# Patient Record
Sex: Female | Born: 1990 | Race: Black or African American | Hispanic: No | Marital: Single | Smoking: Former smoker
Health system: Southern US, Community
[De-identification: ages and names within clinical notes are randomized; demographics above are authoritative.]

## PROBLEM LIST (undated history)

## (undated) DIAGNOSIS — O1494 Unspecified pre-eclampsia, complicating childbirth: Secondary | ICD-10-CM

## (undated) DIAGNOSIS — Z98891 History of uterine scar from previous surgery: Secondary | ICD-10-CM

## (undated) HISTORY — DX: Unspecified pre-eclampsia, complicating childbirth: O14.94

## (undated) HISTORY — PX: TONSILLECTOMY AND ADENOIDECTOMY: SUR1326

---
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
## 2012-08-03 ENCOUNTER — Emergency Department: Payer: Self-pay | Admitting: Emergency Medicine

## 2012-08-03 LAB — URINALYSIS, COMPLETE
Bacteria: NONE SEEN
Glucose,UR: NEGATIVE mg/dL (ref 0–75)
Nitrite: NEGATIVE
Protein: NEGATIVE
Specific Gravity: 1.014 (ref 1.003–1.030)

## 2012-08-03 LAB — HCG, QUANTITATIVE, PREGNANCY: Beta Hcg, Quant.: 21498 m[IU]/mL — ABNORMAL HIGH

## 2012-08-03 LAB — CBC
HGB: 12.3 g/dL (ref 12.0–16.0)
MCHC: 34.2 g/dL (ref 32.0–36.0)
RBC: 4.05 10*6/uL (ref 3.80–5.20)
WBC: 5.4 10*3/uL (ref 3.6–11.0)

## 2012-09-21 ENCOUNTER — Emergency Department: Payer: Self-pay | Admitting: Internal Medicine

## 2012-09-21 LAB — URINALYSIS, COMPLETE
Bilirubin,UR: NEGATIVE
Blood: NEGATIVE
Glucose,UR: NEGATIVE mg/dL (ref 0–75)
Hyaline Cast: 8
Nitrite: NEGATIVE
Ph: 5 (ref 4.5–8.0)
Protein: 25
RBC,UR: 2 /HPF (ref 0–5)
Specific Gravity: 1.015 (ref 1.003–1.030)
Squamous Epithelial: 3
WBC UR: 2 /HPF (ref 0–5)

## 2012-09-21 LAB — HCG, QUANTITATIVE, PREGNANCY: Beta Hcg, Quant.: 27275 m[IU]/mL — ABNORMAL HIGH

## 2012-12-11 ENCOUNTER — Observation Stay: Payer: Self-pay

## 2012-12-11 LAB — URINALYSIS, COMPLETE
Bacteria: NONE SEEN
Glucose,UR: NEGATIVE mg/dL (ref 0–75)
Ketone: NEGATIVE
Protein: NEGATIVE
RBC,UR: 2 /HPF (ref 0–5)
Specific Gravity: 1.014 (ref 1.003–1.030)
WBC UR: 14 /HPF (ref 0–5)

## 2013-01-27 ENCOUNTER — Observation Stay: Payer: Self-pay

## 2013-01-27 LAB — PIH PROFILE
BUN: 5 mg/dL — ABNORMAL LOW (ref 7–18)
Co2: 25 mmol/L (ref 21–32)
EGFR (African American): >60
EGFR (Non-African Amer.): >60
HCT: 32.8 % — ABNORMAL LOW (ref 35.0–47.0)
HGB: 11.2 g/dL — ABNORMAL LOW (ref 12.0–16.0)
MCH: 30.4 pg (ref 26.0–34.0)
MCHC: 34.2 g/dL (ref 32.0–36.0)
Platelet: 345 10*3/uL (ref 150–440)
Potassium: 4 mmol/L (ref 3.5–5.1)
RBC: 3.7 10*6/uL — ABNORMAL LOW (ref 3.80–5.20)
RDW: 13.5 % (ref 11.5–14.5)
SGOT(AST): 10 U/L — ABNORMAL LOW (ref 15–37)
Sodium: 139 mmol/L (ref 136–145)
Uric Acid: 3.6 mg/dL (ref 2.6–6.0)
WBC: 9.1 10*3/uL (ref 3.6–11.0)

## 2013-01-27 LAB — PROTEIN / CREATININE RATIO, URINE: Protein/Creat. Ratio: 91 mg/gCREAT (ref 0–200)

## 2013-02-10 ENCOUNTER — Observation Stay: Payer: Self-pay

## 2013-02-10 LAB — PIH PROFILE
Anion Gap: 7 (ref 7–16)
BUN: 6 mg/dL — ABNORMAL LOW (ref 7–18)
Creatinine: 0.62 mg/dL (ref 0.60–1.30)
EGFR (African American): >60
EGFR (Non-African Amer.): >60
Glucose: 69 mg/dL (ref 65–99)
HCT: 34.2 % — ABNORMAL LOW (ref 35.0–47.0)
MCH: 30.5 pg (ref 26.0–34.0)
MCHC: 34.6 g/dL (ref 32.0–36.0)
MCV: 88 fL (ref 80–100)
Platelet: 324 10*3/uL (ref 150–440)
Potassium: 4 mmol/L (ref 3.5–5.1)
RBC: 3.89 10*6/uL (ref 3.80–5.20)
RDW: 13.2 % (ref 11.5–14.5)
SGOT(AST): 13 U/L — ABNORMAL LOW (ref 15–37)
Uric Acid: 4.1 mg/dL (ref 2.6–6.0)

## 2013-02-10 LAB — PROTEIN / CREATININE RATIO, URINE
Protein, Random Urine: 46 mg/dL — ABNORMAL HIGH (ref 0–12)
Protein/Creat. Ratio: 139 mg/gCREAT (ref 0–200)

## 2013-02-13 ENCOUNTER — Observation Stay: Payer: Self-pay | Admitting: Obstetrics and Gynecology

## 2013-02-17 ENCOUNTER — Encounter: Payer: Self-pay | Admitting: Maternal & Fetal Medicine

## 2013-02-17 ENCOUNTER — Inpatient Hospital Stay: Payer: Self-pay | Admitting: Obstetrics and Gynecology

## 2013-02-17 LAB — CBC WITH DIFFERENTIAL/PLATELET
Basophil #: 0 10*3/uL (ref 0.0–0.1)
Basophil %: 0.5 %
Eosinophil #: 0.1 10*3/uL (ref 0.0–0.7)
Eosinophil %: 1.5 %
HGB: 11.8 g/dL — ABNORMAL LOW (ref 12.0–16.0)
Lymphocyte #: 1.9 10*3/uL (ref 1.0–3.6)
Lymphocyte %: 19.5 %
MCHC: 34.8 g/dL (ref 32.0–36.0)
MCV: 89 fL (ref 80–100)
Monocyte #: 0.7 x10 3/mm (ref 0.2–0.9)
Monocyte %: 7.3 %
Neutrophil %: 71.2 %
Platelet: 326 10*3/uL (ref 150–440)
RBC: 3.83 10*6/uL (ref 3.80–5.20)
RDW: 13.3 % (ref 11.5–14.5)
WBC: 9.7 10*3/uL (ref 3.6–11.0)

## 2013-02-17 LAB — PROTEIN / CREATININE RATIO, URINE
Creatinine, Urine: 128.8 mg/dL — ABNORMAL HIGH (ref 30.0–125.0)
Protein/Creat. Ratio: 241 mg/gCREAT — ABNORMAL HIGH (ref 0–200)

## 2013-02-18 LAB — HEMATOCRIT: HCT: 33.5 % — ABNORMAL LOW (ref 35.0–47.0)

## 2013-07-04 ENCOUNTER — Emergency Department: Payer: Self-pay | Admitting: Emergency Medicine

## 2013-07-04 LAB — COMPREHENSIVE METABOLIC PANEL
Alkaline Phosphatase: 69 U/L
Anion Gap: 7 (ref 7–16)
BUN: 7 mg/dL (ref 7–18)
Calcium, Total: 9.1 mg/dL (ref 8.5–10.1)
Co2: 25 mmol/L (ref 21–32)
Creatinine: 0.7 mg/dL (ref 0.60–1.30)
EGFR (Non-African Amer.): >60
Glucose: 95 mg/dL (ref 65–99)
Osmolality: 275 (ref 275–301)
SGOT(AST): 9 U/L — ABNORMAL LOW (ref 15–37)
SGPT (ALT): 13 U/L (ref 12–78)
Sodium: 139 mmol/L (ref 136–145)
Total Protein: 7.7 g/dL (ref 6.4–8.2)

## 2013-07-04 LAB — URINALYSIS, COMPLETE
Glucose,UR: NEGATIVE mg/dL (ref 0–75)
Nitrite: NEGATIVE
Ph: 5 (ref 4.5–8.0)
RBC,UR: 8 /HPF (ref 0–5)
Specific Gravity: 1.013 (ref 1.003–1.030)
Squamous Epithelial: 2
WBC UR: 30 /HPF (ref 0–5)

## 2013-07-04 LAB — CBC
HGB: 12.2 g/dL (ref 12.0–16.0)
RDW: 13.3 % (ref 11.5–14.5)
WBC: 6.7 10*3/uL (ref 3.6–11.0)

## 2013-07-04 LAB — HCG, QUANTITATIVE, PREGNANCY: Beta Hcg, Quant.: 38690 m[IU]/mL — ABNORMAL HIGH

## 2013-07-04 LAB — LIPASE, BLOOD: Lipase: 90 U/L (ref 73–393)

## 2013-07-05 LAB — GC/CHLAMYDIA PROBE AMP

## 2013-07-15 ENCOUNTER — Emergency Department: Payer: Self-pay | Admitting: Emergency Medicine

## 2013-07-15 LAB — COMPREHENSIVE METABOLIC PANEL
Albumin: 3.4 g/dL (ref 3.4–5.0)
BUN: 6 mg/dL — ABNORMAL LOW (ref 7–18)
Bilirubin,Total: 0.6 mg/dL (ref 0.2–1.0)
Calcium, Total: 8.8 mg/dL (ref 8.5–10.1)
Co2: 24 mmol/L (ref 21–32)
Creatinine: 0.69 mg/dL (ref 0.60–1.30)
EGFR (African American): >60
Glucose: 87 mg/dL (ref 65–99)
Osmolality: 275 (ref 275–301)
Potassium: 3.3 mmol/L — ABNORMAL LOW (ref 3.5–5.1)
SGOT(AST): 16 U/L (ref 15–37)
SGPT (ALT): 12 U/L (ref 12–78)
Sodium: 139 mmol/L (ref 136–145)
Total Protein: 6.7 g/dL (ref 6.4–8.2)

## 2013-07-15 LAB — WET PREP, GENITAL

## 2013-07-15 LAB — URINALYSIS, COMPLETE
Bacteria: NONE SEEN
Bilirubin,UR: NEGATIVE
Blood: NEGATIVE
Ketone: NEGATIVE
Nitrite: NEGATIVE
Ph: 6 (ref 4.5–8.0)
Protein: NEGATIVE
Specific Gravity: 1.013 (ref 1.003–1.030)
Squamous Epithelial: 1
WBC UR: 12 /HPF (ref 0–5)

## 2013-07-15 LAB — CBC WITH DIFFERENTIAL/PLATELET
Basophil #: 0 10*3/uL (ref 0.0–0.1)
Basophil %: 0.4 %
Eosinophil #: 0.1 10*3/uL (ref 0.0–0.7)
HCT: 33.2 % — ABNORMAL LOW (ref 35.0–47.0)
HGB: 11.1 g/dL — ABNORMAL LOW (ref 12.0–16.0)
Lymphocyte #: 1.6 10*3/uL (ref 1.0–3.6)
MCH: 29.1 pg (ref 26.0–34.0)
MCHC: 33.5 g/dL (ref 32.0–36.0)
Platelet: 330 10*3/uL (ref 150–440)
RBC: 3.83 10*6/uL (ref 3.80–5.20)
WBC: 6.6 10*3/uL (ref 3.6–11.0)

## 2013-07-15 LAB — GC/CHLAMYDIA PROBE AMP

## 2013-07-24 ENCOUNTER — Encounter: Payer: Self-pay | Admitting: *Deleted

## 2013-07-24 ENCOUNTER — Ambulatory Visit (INDEPENDENT_AMBULATORY_CARE_PROVIDER_SITE_OTHER): Payer: Medicaid Other | Admitting: *Deleted

## 2013-07-24 VITALS — BP 115/87 | Ht 62.5 in | Wt 175.4 lb

## 2013-07-24 DIAGNOSIS — Z3481 Encounter for supervision of other normal pregnancy, first trimester: Secondary | ICD-10-CM

## 2013-07-24 DIAGNOSIS — Z3480 Encounter for supervision of other normal pregnancy, unspecified trimester: Secondary | ICD-10-CM

## 2013-07-24 MED ORDER — PROVIDA OB 20-20-1.25 MG PO CAPS: 1.0000 | ORAL_CAPSULE | Freq: Every day | ORAL | Status: DC

## 2013-07-24 MED ORDER — ONDANSETRON 8 MG PO TBDP: 8.0000 mg | ORAL_TABLET | Freq: Three times a day (TID) | ORAL | Status: DC | PRN

## 2013-07-24 NOTE — Progress Notes (Signed)
P-82  Patient is here today for her New OB intake visit.  Bedside ultrasound agrees with her LMP dating of 9 weeks and two days.  CRL measures 9wks 1 day today.  She does plan to breast feed.  She currently has a two year old daughter and a 63 months old son.  She wishes to have her tubes tied following this delivery.  She also wanted to let us know that she is high risk due to pre eclempsia that seems to set in around 27 weeks.  She was delivered early or went into labor early with each pregnancy.  She was seen for her previous pregnancy at [facility] and was not happy with her care so she is transferring her care to [place].  We will request these records and patient will follow up in two weeks with our office.  She will call sooner if she needs any thing from [place].  She has requested a Rx for zofran and prenatal vitamins and this has been called in for her.

## 2013-07-25 LAB — CULTURE, OB URINE
Colony Count: NO GROWTH
Organism ID, Bacteria: NO GROWTH

## 2013-07-25 LAB — OBSTETRIC PANEL
Antibody Screen: NEGATIVE
Basophils Absolute: 0 10*3/uL (ref 0.0–0.1)
Basophils Relative: 0 % (ref 0–1)
Eosinophils Absolute: 0.1 10*3/uL (ref 0.0–0.7)
HCT: 32.6 % — ABNORMAL LOW (ref 36.0–46.0)
Hemoglobin: 11.3 g/dL — ABNORMAL LOW (ref 12.0–15.0)
Hepatitis B Surface Ag: NEGATIVE
Lymphs Abs: 2 10*3/uL (ref 0.7–4.0)
MCHC: 34.7 g/dL (ref 30.0–36.0)
Monocytes Relative: 7 % (ref 3–12)
Neutro Abs: 2.4 10*3/uL (ref 1.7–7.7)
Neutrophils Relative %: 50 % (ref 43–77)
Platelets: 389 10*3/uL (ref 150–400)
Rh Type: POSITIVE

## 2013-08-11 ENCOUNTER — Ambulatory Visit (INDEPENDENT_AMBULATORY_CARE_PROVIDER_SITE_OTHER): Payer: Medicaid Other | Admitting: Obstetrics & Gynecology

## 2013-08-11 ENCOUNTER — Other Ambulatory Visit (HOSPITAL_COMMUNITY)
Admission: RE | Admit: 2013-08-11 | Discharge: 2013-08-11 | Disposition: A | Discharge status: Home / Self Care | Payer: Medicaid Other | Source: Ambulatory Visit | Attending: Obstetrics & Gynecology | Admitting: Obstetrics & Gynecology

## 2013-08-11 VITALS — BP 124/87 | Wt 170.0 lb

## 2013-08-11 DIAGNOSIS — Z01419 Encounter for gynecological examination (general) (routine) without abnormal findings: Secondary | ICD-10-CM | POA: Insufficient documentation

## 2013-08-11 DIAGNOSIS — Z113 Encounter for screening for infections with a predominantly sexual mode of transmission: Secondary | ICD-10-CM | POA: Insufficient documentation

## 2013-08-11 DIAGNOSIS — Z302 Encounter for sterilization: Secondary | ICD-10-CM

## 2013-08-11 DIAGNOSIS — O09291 Supervision of pregnancy with other poor reproductive or obstetric history, first trimester: Secondary | ICD-10-CM

## 2013-08-11 DIAGNOSIS — O09299 Supervision of pregnancy with other poor reproductive or obstetric history, unspecified trimester: Secondary | ICD-10-CM

## 2013-08-11 MED ORDER — ASPIRIN EC 81 MG PO TBEC: 81.0000 mg | DELAYED_RELEASE_TABLET | Freq: Every day | ORAL | Status: DC

## 2013-08-11 NOTE — Progress Notes (Signed)
P = 94 

## 2013-08-11 NOTE — Progress Notes (Signed)
Subjective:    Alicia Friedman is a [id number] at [redacted]w[redacted]d being seen today for her first obstetrical visit.  Her obstetrical history is significant for history of severe preeclampsia resulting in having early deliveries at 36 weeks and 34 weeks. Patient does intend to breast feed. Pregnancy history fully reviewed.  Patient reports no complaints.  Filed Vitals:   08/11/13 1502  BP: 124/87  Weight: 170 lb (77.111 kg)    HISTORY: OB History  Gravida Para Term Preterm AB SAB TAB Ectopic Multiple Living  6 3 0 3 2 2 0 0 0 2     # Outcome Date GA Lbr Len/2nd Weight Sex Delivery Anes PTL Lv  6 CUR           5 PRE 02/17/13 [redacted]w[redacted]d  3 lb 12 oz (1.701 kg) M SVD None  Y     Comments: BP 187/114 delivered due to pre eclempsia.  4 PRE 05/06/11 [redacted]w[redacted]d  4 lb 7 oz (2.013 kg) F SVD EPI  Y     Comments: delivered early due to pre eclempsia  3 PRE 05/15/10 [redacted]w[redacted]d  12.5 oz (0.355 kg) F SVD   SB  2 SAB 10/2009          1 SAB 07/2009             Past Medical History  Diagnosis Date  . Pre-eclampsia, delivered    Past Surgical History  Procedure Laterality Date  . 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 Tonsillectomy and adenoidectomy     Family History  Problem Relation Age of Onset  . Diabetes Mother   . Hypertension Mother   . Lupus Mother   . Thyroid disease Mother   . Cancer Maternal Grandmother     ovarian     Exam    Uterus:     Pelvic Exam:    Perineum: No Hemorrhoids, Normal Perineum   Vulva: normal   Vagina:  normal mucosa, normal discharge   Cervix: multiparous appearance and no bleeding following Pap but patient had yellow discharge and some tenderness during pap smear.   Adnexa: normal adnexa and no mass, fullness, tenderness   Bony Pelvis: gynecoid  System: Breast:  normal appearance, no masses or tenderness   Skin: normal coloration and turgor, no rashes   Neurologic: oriented, normal   Extremities: normal strength, tone, and muscle mass   HEENT PERRLA  and extra ocular movement intact   Mouth/Teeth mucous membranes moist, pharynx normal without lesions and dental hygiene poor   Neck supple and no masses   Cardiovascular: regular rate and rhythm   Respiratory:  appears well, vitals normal, no respiratory distress, acyanotic, normal RR, ear and throat exam is normal, neck free of mass or lymphadenopathy, chest clear, no wheezing, crepitations, rhonchi, normal symmetric air entry   Abdomen: soft, non-tender; bowel sounds normal; no masses,  no organomegaly   Urinary: urethral meatus normal    Assessment:    Pregnancy: [id number] Patient Active Problem List   Diagnosis Date Noted  . Desires Sterilization 08/11/2013  . Hx of severe preeclampsia in prior pregnancies, currently pregnant 08/11/2013     Plan:   Initial labs drawn reviewed and were normal. Continue prenatal vitamins. Recommended Aspirin 81 mg po daily given her history of severe preeclampsia, benefits/risks reviewed.  Patient agrees with this plan and this was prescribed for her.  Problem list reviewed and updated. Genetic Screening discussed First Screen: ordered.  MFM consult  also ordered given her high risk OB history, will follow up recommendations. Ultrasound discussed; fetal survey: to be ordered later. Desires permanent sterilization; papers will be signed around 28 weeks Declines flu vaccine today. Follow up in 4 weeks.   Jaynie Collins, MD, FACOG Attending Obstetrician & Gynecologist Faculty Practice, [facility]

## 2013-08-11 NOTE — Patient Instructions (Addendum)
Pregnancy - First Trimester During sexual intercourse, millions of sperm go into the vagina. Only 1 sperm will penetrate and fertilize the female egg while it is in the Fallopian tube. One week later, the fertilized egg implants into the wall of the uterus. An embryo begins to develop into a baby. At 6 to 8 weeks, the eyes and face are formed and the heartbeat can be seen on ultrasound. At the end of 12 weeks (first trimester), all the baby's organs are formed. Now that you are pregnant, you will want to do everything you can to have a healthy baby. Two of the most important things are to get good prenatal care and follow your caregiver's instructions. Prenatal care is all the medical care you receive before the baby's birth. It is given to prevent, find, and treat problems during the pregnancy and childbirth. PRENATAL EXAMS  During prenatal visits, your weight, blood pressure, and urine are checked. This is done to make sure you are healthy and progressing normally during the pregnancy.  A pregnant woman should gain 25 to 35 pounds during the pregnancy. However, if you are overweight or underweight, your caregiver will advise you regarding your weight.  Your caregiver will ask and answer questions for you.  Blood work, cervical cultures, other necessary tests, and a Pap test are done during your prenatal exams. These tests are done to check on your health and the probable health of your baby. Tests are strongly recommended and done for HIV with your permission. This is the virus that causes AIDS. These tests are done because medicines can be given to help prevent your baby from being born with this infection should you have been infected without knowing it. Blood work is also used to find out your blood type, previous infections, and follow your blood levels (hemoglobin).  Low hemoglobin (anemia) is common during pregnancy. Iron and vitamins are given to help prevent this. Later in the pregnancy,  blood tests for diabetes will be done along with any other tests if any problems develop.  You may need other tests to make sure you and the baby are doing well. CHANGES DURING THE FIRST TRIMESTER  Your body goes through many changes during pregnancy. They vary from person to person. Talk to your caregiver about changes you notice and are concerned about. Changes can include:  Your menstrual period stops.  The egg and sperm carry the genes that determine what you look like. Genes from you and your partner are forming a baby. The female genes determine whether the baby is a boy or a girl.  Your body increases in girth and you may feel bloated.  Feeling sick to your stomach (nauseous) and throwing up (vomiting). If the vomiting is uncontrollable, call your caregiver.  Your breasts will begin to enlarge and become tender.  Your nipples may stick out more and become darker.  The need to urinate more. Painful urination may mean you have a bladder infection.  Tiring easily.  Loss of appetite.  Cravings for certain kinds of food.  At first, you may gain or lose a couple of pounds.  You may have changes in your emotions from day to day (excited to be pregnant or concerned something may go wrong with the pregnancy and baby).  You may have more vivid and strange dreams. HOME CARE INSTRUCTIONS   It is very important to avoid all smoking, alcohol and non-prescribed drugs during your pregnancy. These affect the formation and growth of the baby.   Avoid chemicals while pregnant to ensure the delivery of a healthy infant.  Start your prenatal visits by the 12th week of pregnancy. They are usually scheduled monthly at first, then more often in the last 2 months before delivery. Keep your caregiver's appointments. Follow your caregiver's instructions regarding medicine use, blood and lab tests, exercise, and diet.  During pregnancy, you are providing food for you and your baby. Eat regular,  well-balanced meals. Choose foods such as meat, fish, milk and other low fat dairy products, vegetables, fruits, and whole-grain breads and cereals. Your caregiver will tell you of the ideal weight gain.  You can help morning sickness by keeping soda crackers at the bedside. Eat a couple before arising in the morning. You may want to use the crackers without salt on them.  Eating 4 to 5 small meals rather than 3 large meals a day also may help the nausea and vomiting.  Drinking liquids between meals instead of during meals also seems to help nausea and vomiting.  A physical sexual relationship may be continued throughout pregnancy if there are no other problems. Problems may be early (premature) leaking of amniotic fluid from the membranes, vaginal bleeding, or belly (abdominal) pain.  Exercise regularly if there are no restrictions. Check with your caregiver or physical therapist if you are unsure of the safety of some of your exercises. Greater weight gain will occur in the last 2 trimesters of pregnancy. Exercising will help:  Control your weight.  Keep you in shape.  Prepare you for labor and delivery.  Help you lose your pregnancy weight after you deliver your baby.  Wear a good support or jogging bra for breast tenderness during pregnancy. This may help if worn during sleep too.  Ask when prenatal classes are available. Begin classes when they are offered.  Do not use hot tubs, steam rooms, or saunas.  Wear your seat belt when driving. This protects you and your baby if you are in an accident.  Avoid raw meat, uncooked cheese, cat litter boxes, and soil used by cats throughout the pregnancy. These carry germs that can cause birth defects in the baby.  The first trimester is a good time to visit your dentist for your dental health. Getting your teeth cleaned is okay. Use a softer toothbrush and brush gently during pregnancy.  Ask for help if you have financial, counseling, or  nutritional needs during pregnancy. Your caregiver will be able to offer counseling for these needs as well as refer you for other special needs.  Do not take any medicines or herbs unless told by your caregiver.  Inform your caregiver if there is any mental or physical domestic violence.  Make a list of emergency phone numbers of family, friends, hospital, and police and fire departments.  Write down your questions. Take them to your prenatal visit.  Do not douche.  Do not cross your legs.  If you have to stand for long periods of time, rotate you feet or take small steps in a circle.  You may have more vaginal secretions that may require a sanitary pad. Do not use tampons or scented sanitary pads. MEDICINES AND DRUG USE IN PREGNANCY  Take prenatal vitamins as directed. The vitamin should contain 1 milligram of folic acid. Keep all vitamins out of reach of children. Only a couple vitamins or tablets containing iron may be fatal to a baby or young child when ingested.  Avoid use of all medicines, including herbs, over-the-counter medicines, not   prescribed or suggested by your caregiver. Only take over-the-counter or prescription medicines for pain, discomfort, or fever as directed by your caregiver. Do not use aspirin, ibuprofen, or naproxen unless directed by your caregiver.  Let your caregiver also know about herbs you may be using.  Alcohol is related to a number of birth defects. This includes fetal alcohol syndrome. All alcohol, in any form, should be avoided completely. Smoking will cause low birth rate and premature babies.  Street or illegal drugs are very harmful to the baby. They are absolutely forbidden. A baby born to an addicted mother will be addicted at birth. The baby will go through the same withdrawal an adult does.  Let your caregiver know about any medicines that you have to take and for what reason you take them. SEEK MEDICAL CARE IF:  You have any concerns or  worries during your pregnancy. It is better to call with your questions if you feel they cannot wait, rather than worry about them. SEEK IMMEDIATE MEDICAL CARE IF:   An unexplained oral temperature above 102 F (38.9 C) develops, or as your caregiver suggests.  You have leaking of fluid from the vagina (birth canal). If leaking membranes are suspected, take your temperature and inform your caregiver of this when you call.  There is vaginal spotting or bleeding. Notify your caregiver of the amount and how many pads are used.  You develop a bad smelling vaginal discharge with a change in the color.  You continue to feel sick to your stomach (nauseated) and have no relief from remedies suggested. You vomit blood or coffee ground-like materials.  You lose more than 2 pounds of weight in 1 week.  You gain more than 2 pounds of weight in 1 week and you notice swelling of your face, hands, feet, or legs.  You gain 5 pounds or more in 1 week (even if you do not have swelling of your hands, face, legs, or feet).  You get exposed to [place] measles and have never had them.  You are exposed to fifth disease or chickenpox.  You develop belly (abdominal) pain. Round ligament discomfort is a common non-cancerous (benign) cause of abdominal pain in pregnancy. Your caregiver still must evaluate this.  You develop headache, fever, diarrhea, pain with urination, or shortness of breath.  You fall or are in a car accident or have any kind of trauma.  There is mental or physical violence in your home. Document Released: 07/25/2001 Document Revised: 04/24/2012 Document Reviewed: 01/26/2009 [facility] Patient Information 2014 [place], [place].  Second Trimester of Pregnancy The second trimester is from week 13 through week 28, months 4 through 6. The second trimester is often a time when you feel your best. Your body has also adjusted to being pregnant, and you begin to feel better physically. Usually,  morning sickness has lessened or quit completely, you may have more energy, and you may have an increase in appetite. The second trimester is also a time when the fetus is growing rapidly. At the end of the sixth month, the fetus is about 9 inches long and weighs about 1 pounds. You will likely begin to feel the baby move (quickening) between 18 and 20 weeks of the pregnancy. BODY CHANGES Your body goes through many changes during pregnancy. The changes vary from woman to woman.   Your weight will continue to increase. You will notice your lower abdomen bulging out.  You may begin to get stretch marks on your hips, abdomen,  and breasts.  You may develop headaches that can be relieved by medicines approved by your caregiver.  You may urinate more often because the fetus is pressing on your bladder.  You may develop or continue to have heartburn as a result of your pregnancy.  You may develop constipation because certain hormones are causing the muscles that push waste through your intestines to slow down.  You may develop hemorrhoids or swollen, bulging veins (varicose veins).  You may have back pain because of the weight gain and pregnancy hormones relaxing your joints between the bones in your pelvis and as a result of a shift in weight and the muscles that support your balance.  Your breasts will continue to grow and be tender.  Your gums may bleed and may be sensitive to brushing and flossing.  Dark spots or blotches (chloasma, mask of pregnancy) may develop on your face. This will likely fade after the baby is born.  A dark line from your belly button to the pubic area (linea nigra) may appear. This will likely fade after the baby is born. WHAT TO EXPECT AT YOUR PRENATAL VISITS During a routine prenatal visit:  You will be weighed to make sure you and the fetus are growing normally.  Your blood pressure will be taken.  Your abdomen will be measured to track your baby's  growth.  The fetal heartbeat will be listened to.  Any test results from the previous visit will be discussed. Your caregiver may ask you:  How you are feeling.  If you are feeling the baby move.  If you have had any abnormal symptoms, such as leaking fluid, bleeding, severe headaches, or abdominal cramping.  If you have any questions. Other tests that may be performed during your second trimester include:  Blood tests that check for:  Low iron levels (anemia).  Gestational diabetes (between 24 and 28 weeks).  Rh antibodies.  Urine tests to check for infections, diabetes, or protein in the urine.  An ultrasound to confirm the proper growth and development of the baby.  An amniocentesis to check for possible genetic problems.  Fetal screens for spina bifida and Down syndrome. HOME CARE INSTRUCTIONS   Avoid all smoking, herbs, alcohol, and unprescribed drugs. These chemicals affect the formation and growth of the baby.  Follow your caregiver's instructions regarding medicine use. There are medicines that are either safe or unsafe to take during pregnancy.  Exercise only as directed by your caregiver. Experiencing uterine cramps is a good sign to stop exercising.  Continue to eat regular, healthy meals.  Wear a good support bra for breast tenderness.  Do not use hot tubs, steam rooms, or saunas.  Wear your seat belt at all times when driving.  Avoid raw meat, uncooked cheese, cat litter boxes, and soil used by cats. These carry germs that can cause birth defects in the baby.  Take your prenatal vitamins.  Try taking a stool softener (if your caregiver approves) if you develop constipation. Eat more high-fiber foods, such as fresh vegetables or fruit and whole grains. Drink plenty of fluids to keep your urine clear or pale yellow.  Take warm sitz baths to soothe any pain or discomfort caused by hemorrhoids. Use hemorrhoid cream if your caregiver approves.  If you  develop varicose veins, wear support hose. Elevate your feet for 15 minutes, 3 4 times a day. Limit salt in your diet.  Avoid heavy lifting, wear low heel shoes, and practice good posture.  Rest  with your legs elevated if you have leg cramps or low back pain.  Visit your dentist if you have not gone yet during your pregnancy. Use a soft toothbrush to brush your teeth and be gentle when you floss.  A sexual relationship may be continued unless your caregiver directs you otherwise.  Continue to go to all your prenatal visits as directed by your caregiver. SEEK MEDICAL CARE IF:   You have dizziness.  You have mild pelvic cramps, pelvic pressure, or nagging pain in the abdominal area.  You have persistent nausea, vomiting, or diarrhea.  You have a bad smelling vaginal discharge.  You have pain with urination. SEEK IMMEDIATE MEDICAL CARE IF:   You have a fever.  You are leaking fluid from your vagina.  You have spotting or bleeding from your vagina.  You have severe abdominal cramping or pain.  You have rapid weight gain or loss.  You have shortness of breath with chest pain.  You notice sudden or extreme swelling of your face, hands, ankles, feet, or legs.  You have not felt your baby move in over an hour.  You have severe headaches that do not go away with medicine.  You have vision changes. Document Released: 07/25/2001 Document Revised: 04/02/2013 Document Reviewed: 10/01/2012 [facility] Patient Information 2014 [place], [place].  Breastfeeding Deciding to breastfeed is one of the best choices you can make for you and your baby. A change in hormones during pregnancy causes your breast tissue to grow and increases the number and size of your milk ducts. These hormones also allow proteins, sugars, and fats from your blood supply to make breast milk in your milk-producing glands. Hormones prevent breast milk from being released before your baby is born as well as prompt milk  flow after birth. Once breastfeeding has begun, thoughts of your baby, as well as his or her sucking or crying, can stimulate the release of milk from your milk-producing glands.  BENEFITS OF BREASTFEEDING For Your Baby  Your first milk (colostrum) helps your baby's digestive system function better.   There are antibodies in your milk that help your baby fight off infections.   Your baby has a lower incidence of asthma, allergies, and sudden infant death syndrome.   The nutrients in breast milk are better for your baby than infant formulas and are designed uniquely for your baby's needs.   Breast milk improves your baby's brain development.   Your baby is less likely to develop other conditions, such as childhood obesity, asthma, or type 2 diabetes mellitus.  For You   Breastfeeding helps to create a very special bond between you and your baby.   Breastfeeding is convenient. Breast milk is always available at the correct temperature and costs nothing.   Breastfeeding helps to burn calories and helps you lose the weight gained during pregnancy.   Breastfeeding makes your uterus contract to its prepregnancy size faster and slows bleeding (lochia) after you give birth.   Breastfeeding helps to lower your risk of developing type 2 diabetes mellitus, osteoporosis, and breast or ovarian cancer later in life. SIGNS THAT YOUR BABY IS HUNGRY Early Signs of Hunger  Increased alertness or activity.  Stretching.  Movement of the head from side to side.  Movement of the head and opening of the mouth when the corner of the mouth or cheek is stroked (rooting).  Increased sucking sounds, smacking lips, cooing, sighing, or squeaking.  Hand-to-mouth movements.  Increased sucking of fingers or hands. Late Signs  of Hunger  Fussing.  Intermittent crying. Extreme Signs of Hunger Signs of extreme hunger will require calming and consoling before your baby will be able to breastfeed  successfully. Do not wait for the following signs of extreme hunger to occur before you initiate breastfeeding:   Restlessness.  A loud, strong cry.   Screaming. BREASTFEEDING BASICS Breastfeeding Initiation  Find a comfortable place to sit or lie down, with your neck and back well supported.  Place a pillow or rolled up blanket under your baby to bring him or her to the level of your breast (if you are seated). Nursing pillows are specially designed to help support your arms and your baby while you breastfeed.  Make sure that your baby's abdomen is facing your abdomen.   Gently massage your breast. With your fingertips, massage from your chest wall toward your nipple in a circular motion. This encourages milk flow. You may need to continue this action during the feeding if your milk flows slowly.  Support your breast with 4 fingers underneath and your thumb above your nipple. Make sure your fingers are well away from your nipple and your baby's mouth.   Stroke your baby's lips gently with your finger or nipple.   When your baby's mouth is open wide enough, quickly bring your baby to your breast, placing your entire nipple and as much of the colored area around your nipple (areola) as possible into your baby's mouth.   More areola should be visible above your baby's upper lip than below the lower lip.   Your baby's tongue should be between his or her lower gum and your breast.   Ensure that your baby's mouth is correctly positioned around your nipple (latched). Your baby's lips should create a seal on your breast and be turned out (everted).  It is common for your baby to suck about 2 3 minutes in order to start the flow of breast milk. Latching Teaching your baby how to latch on to your breast properly is very important. An improper latch can cause nipple pain and decreased milk supply for you and poor weight gain in your baby. Also, if your baby is not latched onto your  nipple properly, he or she may swallow some air during feeding. This can make your baby fussy. Burping your baby when you switch breasts during the feeding can help to get rid of the air. However, teaching your baby to latch on properly is still the best way to prevent fussiness from swallowing air while breastfeeding. Signs that your baby has successfully latched on to your nipple:    Silent tugging or silent sucking, without causing you pain.   Swallowing heard between every 3 4 sucks.    Muscle movement above and in front of his or her ears while sucking.  Signs that your baby has not successfully latched on to nipple:   Sucking sounds or smacking sounds from your baby while breastfeeding.  Nipple pain. If you think your baby has not latched on correctly, slip your finger into the corner of your baby's mouth to break the suction and place it between your baby's gums. Attempt breastfeeding initiation again. Signs of Successful Breastfeeding Signs from your baby:   A gradual decrease in the number of sucks or complete cessation of sucking.   Falling asleep.   Relaxation of his or her body.   Retention of a small amount of milk in his or her mouth.   Letting go of your breast  by himself or herself. Signs from you:  Breasts that have increased in firmness, weight, and size 1 3 hours after feeding.   Breasts that are softer immediately after breastfeeding.  Increased milk volume, as well as a change in milk consistency and color by the 5th day of breastfeeding.   Nipples that are not sore, cracked, or bleeding. Signs That Your Pecola Leisure is Getting Enough Milk  Wetting at least 3 diapers in a 24-hour period. The urine should be clear and pale yellow by age 18 days.  At least 3 stools in a 24-hour period by age 18 days. The stool should be soft and yellow.  At least 3 stools in a 24-hour period by age 41 days. The stool should be seedy and yellow.  No loss of weight greater  than 10% of birth weight during the first 17 days of age.  Average weight gain of 4 7 ounces (120 210 mL) per week after age 41 days.  Consistent daily weight gain by age 18 days, without weight loss after the age of 2 weeks. After a feeding, your baby may spit up a small amount. This is common. BREASTFEEDING FREQUENCY AND DURATION Frequent feeding will help you make more milk and can prevent sore nipples and breast engorgement. Breastfeed when you feel the need to reduce the fullness of your breasts or when your baby shows signs of hunger. This is called "breastfeeding on demand." Avoid introducing a pacifier to your baby while you are working to establish breastfeeding (the first 4 6 weeks after your baby is born). After this time you may choose to use a pacifier. Research has shown that pacifier use during the first year of a baby's life decreases the risk of sudden infant death syndrome (SIDS). Allow your baby to feed on each breast as long as he or she wants. Breastfeed until your baby is finished feeding. When your baby unlatches or falls asleep while feeding from the first breast, offer the second breast. Because newborns are often sleepy in the first few weeks of life, you may need to awaken your baby to get him or her to feed. Breastfeeding times will vary from baby to baby. However, the following rules can serve as a guide to help you ensure that your baby is properly fed:  Newborns (babies 41 weeks of age or younger) may breastfeed every 1 3 hours.  Newborns should not go longer than 3 hours during the day or 5 hours during the night without breastfeeding.  You should breastfeed your baby a minimum of 8 times in a 24-hour period until you begin to introduce solid foods to your baby at around 86 months of age. BREAST MILK PUMPING Pumping and storing breast milk allows you to ensure that your baby is exclusively fed your breast milk, even at times when you are unable to breastfeed. This is  especially important if you are going back to work while you are still breastfeeding or when you are not able to be present during feedings. Your lactation consultant can give you guidelines on how long it is safe to store breast milk.  A breast pump is a machine that allows you to pump milk from your breast into a sterile bottle. The pumped breast milk can then be stored in a refrigerator or freezer. Some breast pumps are operated by hand, while others use electricity. Ask your lactation consultant which type will work best for you. Breast pumps can be purchased, but some hospitals and  breastfeeding support groups lease breast pumps on a monthly basis. A lactation consultant can teach you how to hand express breast milk, if you prefer not to use a pump.  CARING FOR YOUR BREASTS WHILE YOU BREASTFEED Nipples can become dry, cracked, and sore while breastfeeding. The following recommendations can help keep your breasts moisturized and healthy:  Avoid using soap on your nipples.   Wear a supportive bra. Although not required, special nursing bras and tank tops are designed to allow access to your breasts for breastfeeding without taking off your entire bra or top. Avoid wearing underwire style bras or extremely tight bras.  Air dry your nipples for 3 after each feeding.   Use only cotton bra pads to absorb leaked breast milk. Leaking of breast milk between feedings is normal.   Use lanolin on your nipples after breastfeeding. Lanolin helps to maintain your skin's normal moisture barrier. If you use pure lanolin you do not need to wash it off before feeding your baby again. Pure lanolin is not toxic to your baby. You may also hand express a few drops of breast milk and gently massage that milk into your nipples and allow the milk to air dry. In the first few weeks after giving birth, some women experience extremely full breasts (engorgement). Engorgement can make your breasts feel heavy, warm,  and tender to the touch. Engorgement peaks within 3 5 days after you give birth. The following recommendations can help ease engorgement:  Completely empty your breasts while breastfeeding or pumping. You may want to start by applying warm, moist heat (in the shower or with warm water-soaked hand towels) just before feeding or pumping. This increases circulation and helps the milk flow. If your baby does not completely empty your breasts while breastfeeding, pump any extra milk after he or she is finished.  Wear a snug bra (nursing or regular) or tank top for 1 2 days to signal your body to slightly decrease milk production.  Apply ice packs to your breasts, unless this is too uncomfortable for you.  Make sure that your baby is latched on and positioned properly while breastfeeding. If engorgement persists after 48 hours of following these recommendations, contact your health care provider or a Advertising copywriter. OVERALL HEALTH CARE RECOMMENDATIONS WHILE BREASTFEEDING  Eat healthy foods. Alternate between meals and snacks, eating 3 of each per day. Because what you eat affects your breast milk, some of the foods may make your baby more irritable than usual. Avoid eating these foods if you are sure that they are negatively affecting your baby.  Drink milk, fruit juice, and water to satisfy your thirst (about 10 glasses a day).   Rest often, relax, and continue to take your prenatal vitamins to prevent fatigue, stress, and anemia.  Continue breast self-awareness checks.  Avoid chewing and smoking tobacco.  Avoid alcohol and drug use. Some medicines that may be harmful to your baby can pass through breast milk. It is important to ask your health care provider before taking any medicine, including all over-the-counter and prescription medicine as well as vitamin and herbal supplements. It is possible to become pregnant while breastfeeding. If birth control is desired, ask your health care  provider about options that will be safe for your baby. SEEK MEDICAL CARE IF:   You feel like you want to stop breastfeeding or have become frustrated with breastfeeding.  You have painful breasts or nipples.  Your nipples are cracked or bleeding.  Your breasts are  red, tender, or warm.  You have a swollen area on either breast.  You have a fever or chills.  You have nausea or vomiting.  You have drainage other than breast milk from your nipples.  Your breasts do not become full before feedings by the 5th day after you give birth.  You feel sad and depressed.  Your baby is too sleepy to eat well.  Your baby is having trouble sleeping.   Your baby is wetting less than 3 diapers in a 24-hour period.  Your baby has less than 3 stools in a 24-hour period.  Your baby's skin or the white part of his or her eyes becomes yellow.   Your baby is not gaining weight by 29 days of age. SEEK IMMEDIATE MEDICAL CARE IF:   Your baby is overly tired (lethargic) and does not want to wake up and feed.  Your baby develops an unexplained fever. Document Released: 07/31/2005 Document Revised: 04/02/2013 Document Reviewed: 01/22/2013 [facility] Patient Information 2014 [place], [place].   Hypertension During Pregnancy Hypertension is also called high blood pressure. It can occur at any time in life and during pregnancy. When you have hypertension, there is extra pressure inside your blood vessels that carry blood from the heart to the rest of your body (arteries). Hypertension during pregnancy can cause problems for you and your baby. Your baby might not weigh as much as it should at birth or might be born early (premature). Very bad cases of hypertension during pregnancy can be life-threatening.  There are different types of hypertension during pregnancy.   Chronic hypertension. This happens when a woman has hypertension before pregnancy and it continues during pregnancy.  Gestational  hypertension. This is when hypertension develops during pregnancy.  Preeclampsia or toxemia of pregnancy. This is a very serious type of hypertension that develops only during pregnancy. It is a disease that affects the whole body (systemic) and can be very dangerous for both mother and baby.  Gestational hypertension and preeclampsia usually go away after your baby is born. Blood pressure generally stabilizes within 6 weeks. Women who have hypertension during pregnancy have a greater chance of developing hypertension later in life or with future pregnancies. UNDERSTANDING BLOOD PRESSURE Blood pressure moves blood in your body. Sometimes, the force that moves the blood becomes too strong.  A blood pressure reading is given in 2 numbers and looks like a fraction.  The top number is called the systolic pressure. When your heart beats, it forces more blood to flow through the arteries. Pressure inside the arteries goes up.  The bottom number is the diastolic pressure. Pressure goes down between beats. That is when the heart is resting.  You may have hypertension if:  Your systolic blood pressure is above 140.  Your diastolic pressure is above 90. RISK FACTORS Some factors make you more likely to develop hypertension during pregnancy. Risk factors include:  Having hypertension before pregnancy.  Having hypertension during a previous pregnancy.  Being overweight.  Being older than 40.  Being pregnant with more than 1 baby (multiples).  Having diabetes or kidney problems. SYMPTOMS Chronic and gestational hypertension may not cause symptoms. Preeclampsia has symptoms, which may include:  Increased protein in your urine. Your caregiver will check for this at every prenatal visit.  Swelling of your hands and face.  Rapid weight gain.  Headaches.  Visual changes.  Being bothered by light.  Abdominal pain, especially in the right upper area.  Chest pain.  Shortness of  breath.  Increased reflexes.  Seizures. Seizures occur with a more severe form of preeclampsia, called eclampsia. DIAGNOSIS   You may be diagnosed with hypertension during pregnancy during a regular prenatal exam. At each visit, tests may include:  Blood pressure checks.  A urine test to check for protein in your urine.  The type of hypertension you are diagnosed with depends on when you developed it. It also depends on your specific blood pressure reading.  Developing hypertension before 20 weeks of pregnancy is consistent with chronic hypertension.  Developing hypertension after 20 weeks of pregnancy is consistent with gestational hypertension.  Hypertension with increased urinary protein is diagnosed as preeclampsia.  Blood pressure measurements that stay above 160 systolic or 110 diastolic are a sign of severe preeclampsia. TREATMENT Treatment for hypertension during pregnancy varies. Treatment depends on the type of hypertension and how serious it is.  If you take medicine for chronic hypertension, you may need to switch medicines.  Drugs called ACE inhibitors should not be taken during pregnancy.  Low-dose aspirin may be suggested for women who have risk factors for preeclampsia.  If you have gestational hypertension, you may need to take a blood pressure medicine that is safe during pregnancy. Your caregiver will recommend the appropriate medicine.  If you have severe preeclampsia, you may need to be in the hospital. Caregivers will watch you and the baby very closely. You also may need to take medicine (magnesium sulfate) to prevent seizures and lower blood pressure.  Sometimes an early delivery is needed. This may be the case if the condition worsens. It would be done to protect you and the baby. The only cure for preeclampsia is delivery. HOME CARE INSTRUCTIONS  Schedule and keep all of your regular prenatal care.  Follow your caregiver's instructions for taking  medicines. Tell your caregiver about all medicines you take. This includes over-the-counter medicines.  Eat as little salt as possible.  Get regular exercise.  Do not drink alcohol.  Do not use tobacco products.  Do not drink products with caffeine.  Lie on your left side when resting.  Tell your doctor if you have any preeclampsia symptoms. SEEK IMMEDIATE MEDICAL CARE IF:  You have severe abdominal pain.  You have sudden swelling in the hands, ankles, or face.  You gain 4 pounds (1.8 kg) or more in 1 week.  You vomit repeatedly.  You have vaginal bleeding.  You do not feel the baby moving as much.  You have a headache.  You have blurred or double vision.  You have muscle twitching or spasms.  You have shortness of breath.  You have blue fingernails and lips.  You have blood in your urine. MAKE SURE YOU:  Understand these instructions.  Will watch your condition.  Will get help right away if you are not doing well. Document Released: 04/18/2011 Document Revised: 10/23/2011 Document Reviewed: 04/18/2011 [facility] Patient Information 2014 [place], [place].

## 2013-08-19 ENCOUNTER — Other Ambulatory Visit: Payer: Self-pay | Admitting: Obstetrics & Gynecology

## 2013-08-19 DIAGNOSIS — Z3682 Encounter for antenatal screening for nuchal translucency: Secondary | ICD-10-CM

## 2013-08-21 ENCOUNTER — Encounter (HOSPITAL_COMMUNITY): Payer: Self-pay

## 2013-08-21 ENCOUNTER — Ambulatory Visit (HOSPITAL_COMMUNITY)
Admission: RE | Admit: 2013-08-21 | Discharge: 2013-08-21 | Disposition: A | Discharge status: Home / Self Care | Payer: Medicaid Other | Source: Ambulatory Visit | Attending: Obstetrics & Gynecology | Admitting: Obstetrics & Gynecology

## 2013-08-21 DIAGNOSIS — O3510X0 Maternal care for (suspected) chromosomal abnormality in fetus, unspecified, not applicable or unspecified: Secondary | ICD-10-CM | POA: Insufficient documentation

## 2013-08-21 DIAGNOSIS — Z3689 Encounter for other specified antenatal screening: Secondary | ICD-10-CM | POA: Insufficient documentation

## 2013-08-21 DIAGNOSIS — Z3682 Encounter for antenatal screening for nuchal translucency: Secondary | ICD-10-CM

## 2013-08-21 DIAGNOSIS — O351XX0 Maternal care for (suspected) chromosomal abnormality in fetus, not applicable or unspecified: Secondary | ICD-10-CM | POA: Insufficient documentation

## 2013-08-21 DIAGNOSIS — O09299 Supervision of pregnancy with other poor reproductive or obstetric history, unspecified trimester: Secondary | ICD-10-CM | POA: Insufficient documentation

## 2013-08-21 DIAGNOSIS — Z8751 Personal history of pre-term labor: Secondary | ICD-10-CM | POA: Insufficient documentation

## 2013-08-26 ENCOUNTER — Other Ambulatory Visit: Payer: Self-pay

## 2013-09-08 ENCOUNTER — Ambulatory Visit (INDEPENDENT_AMBULATORY_CARE_PROVIDER_SITE_OTHER): Payer: Medicaid Other | Admitting: Obstetrics & Gynecology

## 2013-09-08 ENCOUNTER — Encounter: Payer: Self-pay | Admitting: Obstetrics & Gynecology

## 2013-09-08 VITALS — BP 116/70 | Wt 163.8 lb

## 2013-09-08 DIAGNOSIS — Z302 Encounter for sterilization: Secondary | ICD-10-CM

## 2013-09-08 DIAGNOSIS — O219 Vomiting of pregnancy, unspecified: Secondary | ICD-10-CM

## 2013-09-08 DIAGNOSIS — O09299 Supervision of pregnancy with other poor reproductive or obstetric history, unspecified trimester: Secondary | ICD-10-CM

## 2013-09-08 MED ORDER — PROMETHAZINE HCL 25 MG PO TABS: 25.0000 mg | ORAL_TABLET | Freq: Four times a day (QID) | ORAL | Status: DC | PRN

## 2013-09-08 NOTE — Progress Notes (Signed)
Phenergan prescribed for nausea and vomiting, will continue to follow Anatomy ultrasound will  scheduled Normal first screen, AFP only today. No other complaints or concerns.  Routine obstetric precautions reviewed.

## 2013-09-08 NOTE — Progress Notes (Signed)
P-96  Patient is concerned that she has lost 16lbs.  She is still vomitting and feels like the zofran is not helping.

## 2013-09-08 NOTE — Patient Instructions (Signed)
Return to clinic for any obstetric concerns or go to [facility] for evaluation  

## 2013-09-10 LAB — ALPHA FETOPROTEIN, MATERNAL
AFP: 20.8 IU/mL
Curr Gest Age: 15.6 wks.days
MOM FOR AFP: 0.65
Open Spina bifida: NEGATIVE
Osb Risk: <1:54600 {titer}

## 2013-09-11 ENCOUNTER — Encounter: Payer: Self-pay | Admitting: Obstetrics & Gynecology

## 2013-09-29 ENCOUNTER — Ambulatory Visit (HOSPITAL_COMMUNITY)
Admission: RE | Admit: 2013-09-29 | Discharge: 2013-09-29 | Disposition: A | Discharge status: Home / Self Care | Payer: Medicaid Other | Source: Ambulatory Visit | Attending: Obstetrics & Gynecology | Admitting: Obstetrics & Gynecology

## 2013-09-29 ENCOUNTER — Other Ambulatory Visit: Payer: Self-pay | Admitting: Obstetrics & Gynecology

## 2013-09-29 DIAGNOSIS — O09299 Supervision of pregnancy with other poor reproductive or obstetric history, unspecified trimester: Secondary | ICD-10-CM | POA: Insufficient documentation

## 2013-09-29 DIAGNOSIS — O219 Vomiting of pregnancy, unspecified: Secondary | ICD-10-CM

## 2013-09-29 DIAGNOSIS — Z3689 Encounter for other specified antenatal screening: Secondary | ICD-10-CM | POA: Insufficient documentation

## 2013-10-02 ENCOUNTER — Encounter: Payer: Self-pay | Admitting: Obstetrics & Gynecology

## 2013-10-06 ENCOUNTER — Encounter: Payer: Self-pay | Admitting: Obstetrics & Gynecology

## 2013-10-06 ENCOUNTER — Ambulatory Visit (INDEPENDENT_AMBULATORY_CARE_PROVIDER_SITE_OTHER): Payer: Medicaid Other | Admitting: Obstetrics & Gynecology

## 2013-10-06 VITALS — BP 104/67 | Wt 168.0 lb

## 2013-10-06 DIAGNOSIS — O09299 Supervision of pregnancy with other poor reproductive or obstetric history, unspecified trimester: Secondary | ICD-10-CM

## 2013-10-06 NOTE — Progress Notes (Signed)
Routine visit. Good FM. No problems except ringworm. I recommended OTC Lamasil/Lotrimin.

## 2013-10-06 NOTE — Progress Notes (Signed)
P-100.  Pt says she has ringworm in several places on her body and needs a prescription for this.

## 2013-10-29 ENCOUNTER — Observation Stay: Payer: Self-pay | Admitting: Obstetrics and Gynecology

## 2013-11-03 ENCOUNTER — Encounter: Payer: Medicaid Other | Admitting: Obstetrics & Gynecology

## 2013-11-12 ENCOUNTER — Ambulatory Visit (INDEPENDENT_AMBULATORY_CARE_PROVIDER_SITE_OTHER): Payer: Medicaid Other | Admitting: Obstetrics & Gynecology

## 2013-11-12 VITALS — BP 114/78 | Wt 172.0 lb

## 2013-11-12 DIAGNOSIS — IMO0002 Reserved for concepts with insufficient information to code with codable children: Secondary | ICD-10-CM

## 2013-11-12 DIAGNOSIS — Z3480 Encounter for supervision of other normal pregnancy, unspecified trimester: Secondary | ICD-10-CM

## 2013-11-12 DIAGNOSIS — Z0489 Encounter for examination and observation for other specified reasons: Secondary | ICD-10-CM

## 2013-11-12 DIAGNOSIS — Z1389 Encounter for screening for other disorder: Secondary | ICD-10-CM

## 2013-11-12 DIAGNOSIS — O09299 Supervision of pregnancy with other poor reproductive or obstetric history, unspecified trimester: Secondary | ICD-10-CM

## 2013-11-12 DIAGNOSIS — Z302 Encounter for sterilization: Secondary | ICD-10-CM

## 2013-11-12 NOTE — Patient Instructions (Signed)
Return to clinic for any obstetric concerns or go to [facility] for evaluation  

## 2013-11-12 NOTE — Progress Notes (Signed)
Tubal papers signed today. Follow up anatomy scan ordered.  No other complaints or concerns.  Fetal movement and labor precautions reviewed. Third trimester labs, Tdap next visit. Continue ASA for preeclampsia prevention, patient is happy her BP is normal, "by my last pregnancy, it was sky high by now".

## 2013-11-12 NOTE — Progress Notes (Signed)
P = 94 

## 2013-11-19 ENCOUNTER — Ambulatory Visit (HOSPITAL_COMMUNITY)
Admission: RE | Admit: 2013-11-19 | Discharge: 2013-11-19 | Disposition: A | Discharge status: Home / Self Care | Payer: Medicaid Other | Source: Ambulatory Visit | Attending: Obstetrics & Gynecology | Admitting: Obstetrics & Gynecology

## 2013-11-19 DIAGNOSIS — Z3689 Encounter for other specified antenatal screening: Secondary | ICD-10-CM | POA: Insufficient documentation

## 2013-11-19 DIAGNOSIS — Z0489 Encounter for examination and observation for other specified reasons: Secondary | ICD-10-CM

## 2013-11-19 DIAGNOSIS — IMO0002 Reserved for concepts with insufficient information to code with codable children: Secondary | ICD-10-CM

## 2013-11-20 ENCOUNTER — Encounter: Payer: Self-pay | Admitting: Obstetrics & Gynecology

## 2013-11-25 ENCOUNTER — Encounter: Payer: Medicaid Other | Admitting: Obstetrics & Gynecology

## 2013-11-26 ENCOUNTER — Encounter: Payer: Medicaid Other | Admitting: Obstetrics & Gynecology

## 2013-12-03 ENCOUNTER — Ambulatory Visit (INDEPENDENT_AMBULATORY_CARE_PROVIDER_SITE_OTHER): Payer: Medicaid Other | Admitting: Obstetrics & Gynecology

## 2013-12-03 VITALS — BP 119/87 | HR 93 | Wt 179.0 lb

## 2013-12-03 DIAGNOSIS — Z23 Encounter for immunization: Secondary | ICD-10-CM

## 2013-12-03 DIAGNOSIS — O09299 Supervision of pregnancy with other poor reproductive or obstetric history, unspecified trimester: Secondary | ICD-10-CM

## 2013-12-03 LAB — CBC
HCT: 31.2 % — ABNORMAL LOW (ref 36.0–46.0)
Hemoglobin: 11 g/dL — ABNORMAL LOW (ref 12.0–15.0)
MCH: 30.9 pg (ref 26.0–34.0)
MCHC: 35.3 g/dL (ref 30.0–36.0)
MCV: 87.6 fL (ref 78.0–100.0)
PLATELETS: 342 10*3/uL (ref 150–400)
RBC: 3.56 MIL/uL — AB (ref 3.87–5.11)
RDW: 13.2 % (ref 11.5–15.5)
WBC: 7.4 10*3/uL (ref 4.0–10.5)

## 2013-12-03 MED ORDER — TETANUS-DIPHTH-ACELL PERTUSSIS 5-2.5-18.5 LF-MCG/0.5 IM SUSP: 0.5000 mL | Freq: Once | INTRAMUSCULAR | Status: DC

## 2013-12-03 NOTE — Patient Instructions (Signed)
Return to clinic for any obstetric concerns or go to [facility] for evaluation  

## 2013-12-03 NOTE — Progress Notes (Signed)
Third trimester labs today.  Tdap given today.  Complains of pelvic pressure, exam closed/thick/high; patient reassured. Fetal movement and labor precautions reviewed.

## 2013-12-04 ENCOUNTER — Encounter: Payer: Self-pay | Admitting: Obstetrics & Gynecology

## 2013-12-04 LAB — RPR

## 2013-12-04 LAB — HIV ANTIBODY (ROUTINE TESTING W REFLEX): HIV: NONREACTIVE

## 2013-12-04 LAB — GLUCOSE TOLERANCE, 1 HOUR (50G) W/O FASTING: GLUCOSE 1 HOUR GTT: 73 mg/dL (ref 70–140)

## 2013-12-17 ENCOUNTER — Encounter: Payer: Medicaid Other | Admitting: Obstetrics & Gynecology

## 2013-12-22 IMAGING — US US OB < 14 WEEKS - US OB TV
1 series · 14 of 28 positions shown · non-contrast
Comparison: None.

CLINICAL DATA: Abdominal pain and vaginal bleeding

EXAM:
OBSTETRIC <14 WK US AND TRANSVAGINAL OB US
TECHNIQUE: Both transabdominal and transvaginal ultrasound examinations were
performed for complete evaluation of the gestation as well as the
maternal uterus, adnexal regions, and pelvic cul-de-sac.
Transvaginal technique was performed to assess early pregnancy.

[Series 1: us ob < 14 weeks - us ob tv · 0.21mm/px · 14 of 63 slices shown]
[im 3/63]
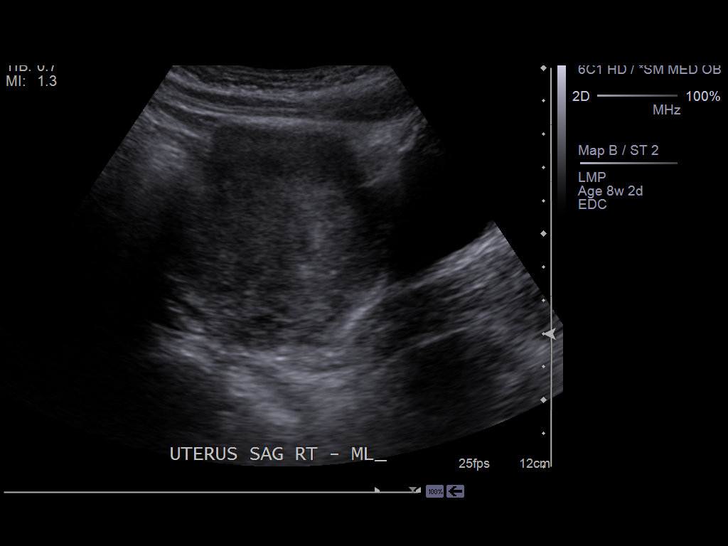
[im 7/63]
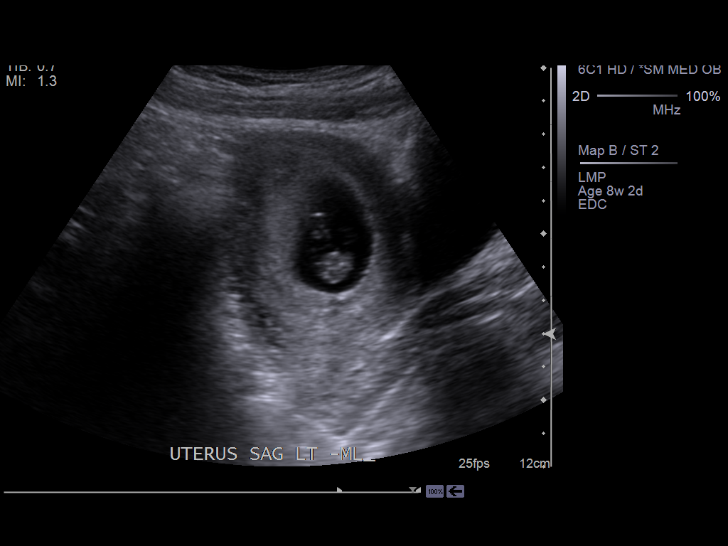
[im 12/63]
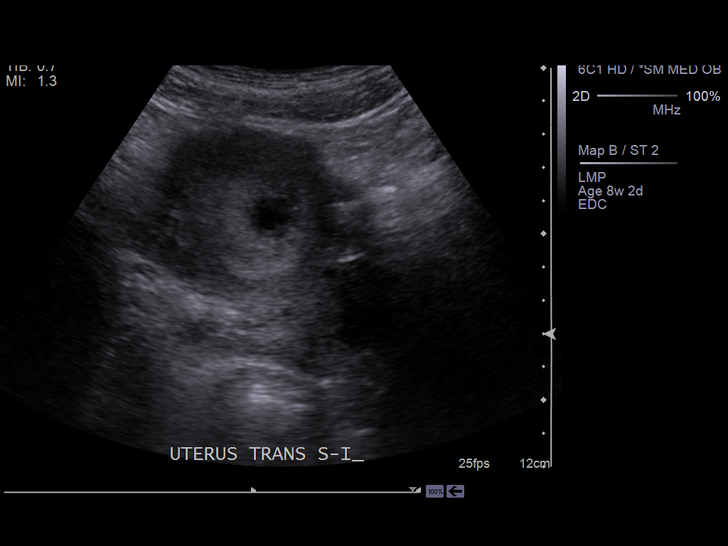
[im 17/63]
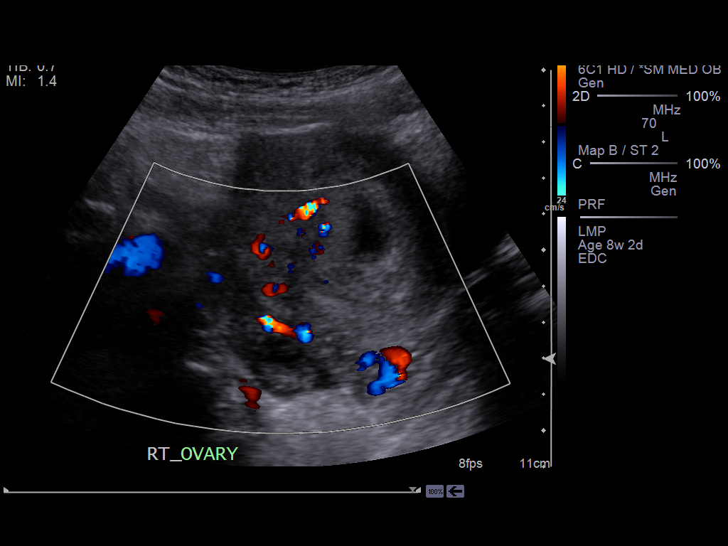
[im 21/63]
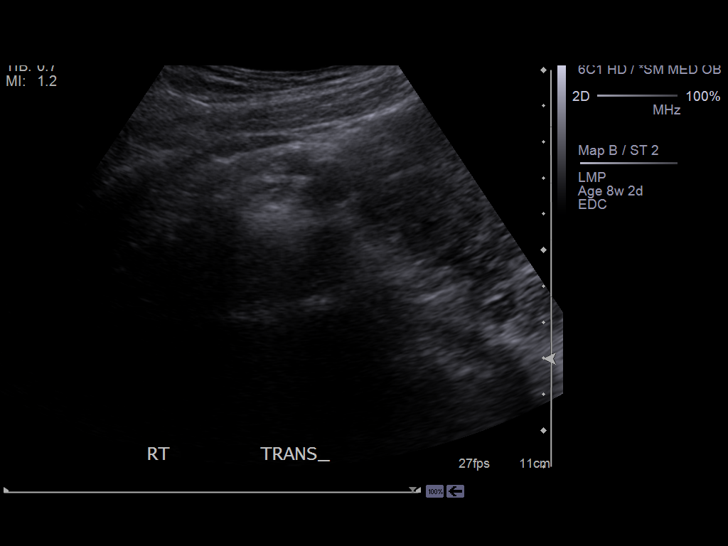
[im 26/63]
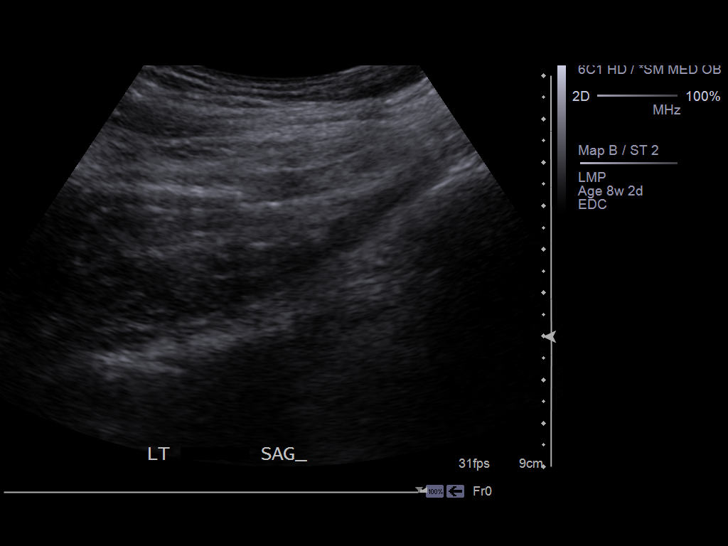
[im 30/63]
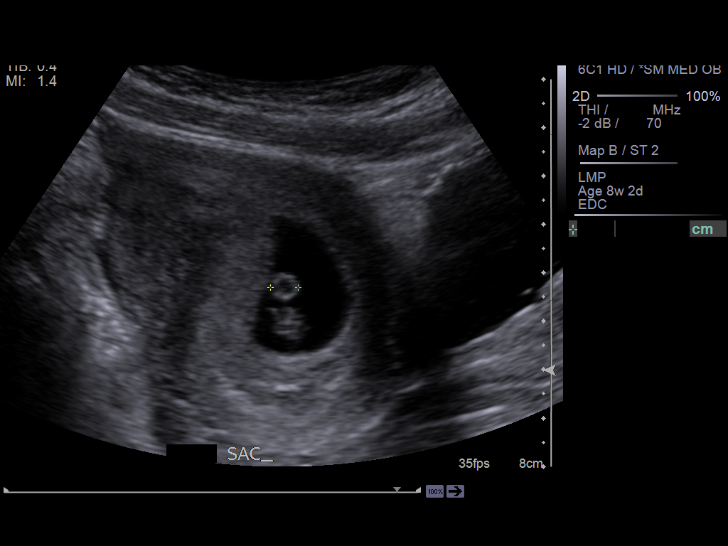
[im 35/63]
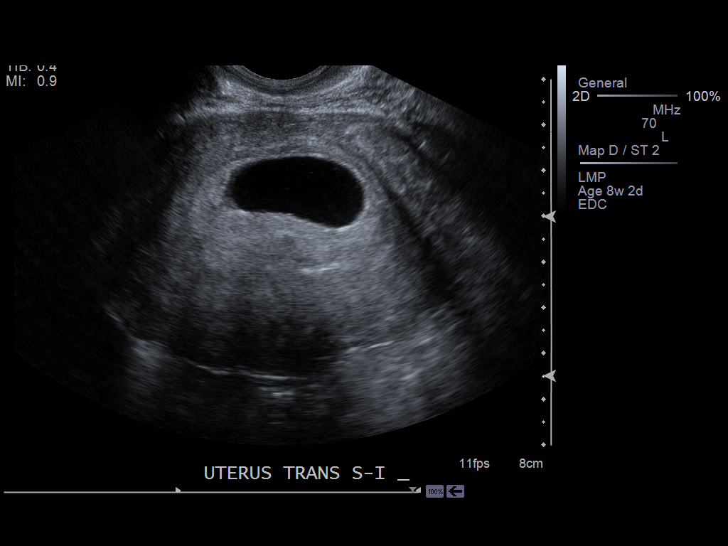
[im 40/63]
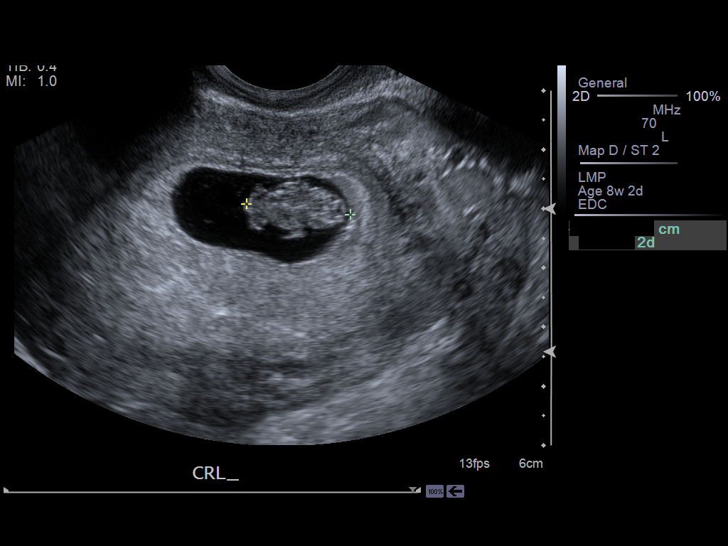
[im 44/63]
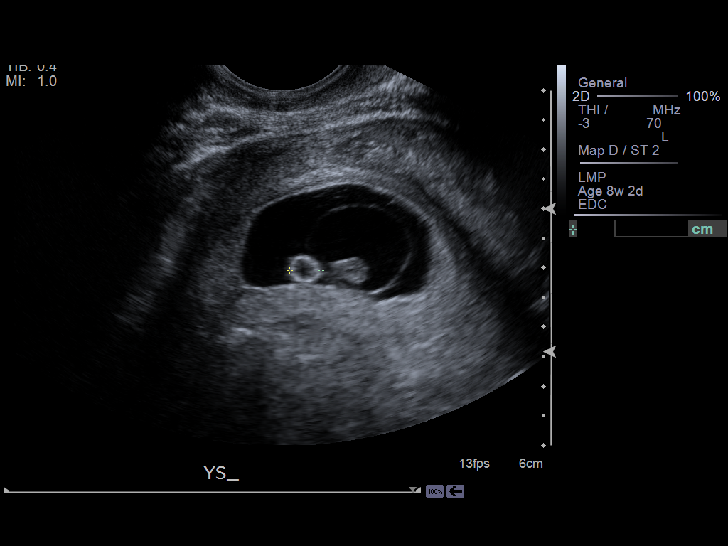
[im 49/63]
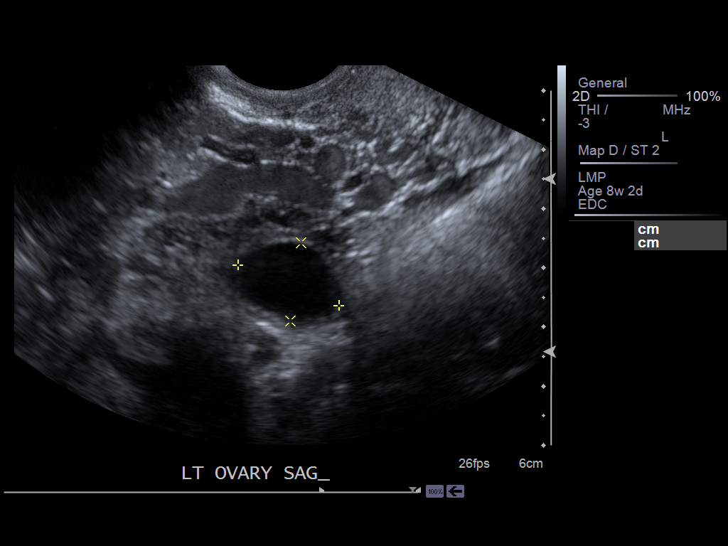
[im 53/63]
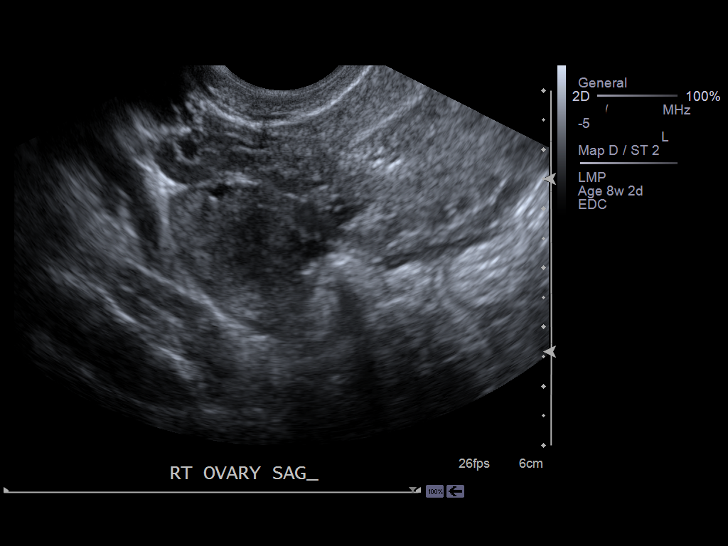
[im 58/63]
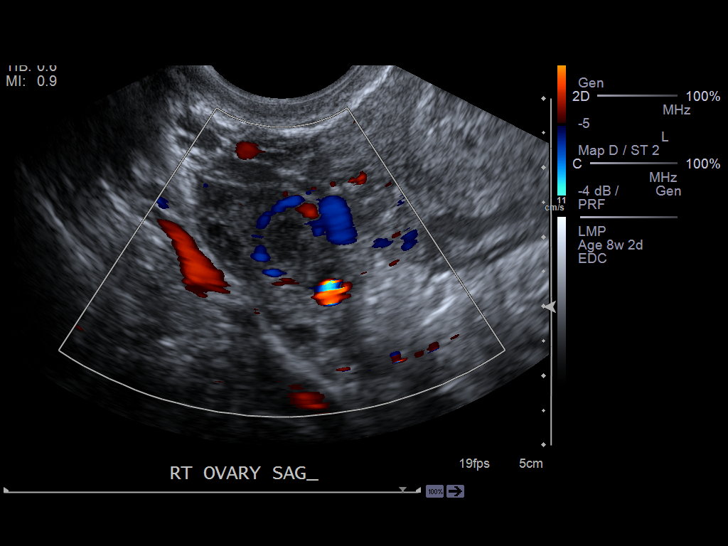
[im 63/63]
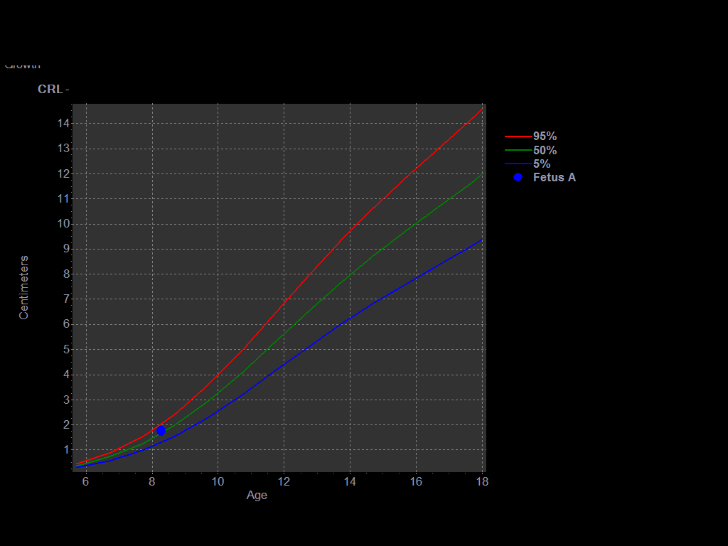

[14 of 28 positions shown; findings below may reference images not displayed]

FINDINGS: Intrauterine gestational sac: Visualized and is normal in shape.

Yolk sac:  Normal.

Embryo:  Normal.

Cardiac Activity: Present.

Heart Rate:  173 bpm

CRL:   17.5  mm   8 W 2d                  US EDC: 02/22/2014.

Maternal uterus/adnexae: There is a 1.7 x 1.6 x 1.5 cm hypoechoic
right ovarian mass with peripheral flow most consistent with a
corpus luteum cyst. There is trace amount of fluid in the
cul-de-sac.
IMPRESSION: Single live intrauterine pregnancy dating 8 weeks 2 days with an EDD
of 02/22/2014.

## 2013-12-25 ENCOUNTER — Ambulatory Visit (INDEPENDENT_AMBULATORY_CARE_PROVIDER_SITE_OTHER): Payer: Medicaid Other | Admitting: Obstetrics & Gynecology

## 2013-12-25 ENCOUNTER — Encounter: Payer: Self-pay | Admitting: Obstetrics & Gynecology

## 2013-12-25 VITALS — BP 130/104 | HR 94 | Wt 180.0 lb

## 2013-12-25 DIAGNOSIS — O09299 Supervision of pregnancy with other poor reproductive or obstetric history, unspecified trimester: Secondary | ICD-10-CM

## 2013-12-25 LAB — CBC
HEMATOCRIT: 31.2 % — AB (ref 36.0–46.0)
HEMOGLOBIN: 10.7 g/dL — AB (ref 12.0–15.0)
MCH: 30.5 pg (ref 26.0–34.0)
MCHC: 34.3 g/dL (ref 30.0–36.0)
MCV: 88.9 fL (ref 78.0–100.0)
Platelets: 326 10*3/uL (ref 150–400)
RBC: 3.51 MIL/uL — AB (ref 3.87–5.11)
RDW: 12.9 % (ref 11.5–15.5)
WBC: 6.4 10*3/uL (ref 4.0–10.5)

## 2013-12-25 MED ORDER — CYCLOBENZAPRINE HCL 10 MG PO TABS: 10.0000 mg | ORAL_TABLET | Freq: Three times a day (TID) | ORAL | Status: DC | PRN

## 2013-12-25 MED ORDER — BUTALBITAL-APAP-CAFFEINE 50-325-40 MG PO CAPS: 1.0000 | ORAL_CAPSULE | Freq: Four times a day (QID) | ORAL | Status: DC | PRN

## 2013-12-25 NOTE — Patient Instructions (Signed)
Hypertension During Pregnancy Hypertension is also called high blood pressure. It can occur at any time in life and during pregnancy. When you have hypertension, there is extra pressure inside your blood vessels that carry blood from the heart to the rest of your body (arteries). Hypertension during pregnancy can cause problems for you and your baby. Your baby might not weigh as much as it should at birth or might be born early (premature). Very bad cases of hypertension during pregnancy can be life threatening.  Different types of hypertension can occur during pregnancy.   Chronic hypertension. This happens when a woman has hypertension before pregnancy and it continues during pregnancy.  Gestational hypertension. This is when hypertension develops during pregnancy.  Preeclampsia or toxemia of pregnancy. This is a very serious type of hypertension that develops only during pregnancy. It is a disease that affects the whole body (systemic) and can be very dangerous for both mother and baby.  Gestational hypertension and preeclampsia usually go away after your baby is born. Blood pressure generally stabilizes within 6 weeks. Women who have hypertension during pregnancy have a greater chance of developing hypertension later in life or with future pregnancies. RISK FACTORS Some factors make you more likely to develop hypertension during pregnancy. Risk factors include:  Having hypertension before pregnancy.  Having hypertension during a previous pregnancy.  Being overweight.  Being older than 40.  Being pregnant with more than one baby (multiples).  Having diabetes or kidney problems. SIGNS AND SYMPTOMS Chronic and gestational hypertension rarely cause symptoms. Preeclampsia has symptoms, which may include:  Increased protein in your urine. Your health care provider will check for this at every prenatal visit.  Swelling of your hands and face.  Rapid weight gain.  Headaches.  Visual  changes.  Being bothered by light.  Abdominal pain, especially in the right upper area.  Chest pain.  Shortness of breath.  Increased reflexes.  Seizures. Seizures occur with a more severe form of preeclampsia, called eclampsia. DIAGNOSIS   You may be diagnosed with hypertension during a regular prenatal exam. At each visit, tests may include:  Blood pressure checks.  A urine test to check for protein in your urine.  The type of hypertension you are diagnosed with depends on when you developed it. It also depends on your specific blood pressure reading.  Developing hypertension before 20 weeks of pregnancy is consistent with chronic hypertension.  Developing hypertension after 20 weeks of pregnancy is consistent with gestational hypertension.  Hypertension with increased urinary protein is diagnosed as preeclampsia.  Blood pressure measurements that stay above 160 systolic or 110 diastolic are a sign of severe preeclampsia. TREATMENT Treatment for hypertension during pregnancy varies. Treatment depends on the type of hypertension and how serious it is.  If you take medicine for chronic hypertension, you may need to switch medicines.  Drugs called ACE inhibitors should not be taken during pregnancy.  Low-dose aspirin may be suggested for women who have risk factors for preeclampsia.  If you have gestational hypertension, you may need to take a blood pressure medicine that is safe during pregnancy. Your health care provider will recommend the appropriate medicine.  If you have severe preeclampsia, you may need to be in the hospital. Health care providers will watch you and the baby very closely. You also may need to take medicine (magnesium sulfate) to prevent seizures and lower blood pressure.  Sometimes an early delivery is needed. This may be the case if the condition worsens. It would   be done to protect you and the baby. The only cure for preeclampsia is delivery. HOME  CARE INSTRUCTIONS  Schedule and keep all of your regular appointments for prenatal care.  Only take over-the-counter or prescription medicines as directed by your health care provider. Tell your health care provider about all medicines you take.  Eat as little salt as possible.  Get regular exercise.  Do not drink alcohol.  Do not use tobacco products.  Do not drink products with caffeine.  Lie on your left side when resting. SEEK IMMEDIATE MEDICAL CARE IF:  You have severe abdominal pain.  You have sudden swelling in the hands, ankles, or face.  You gain 4 pounds (1.8 kg) or more in 1 week.  You vomit repeatedly.  You have vaginal bleeding.  You do not feel the baby moving as much.  You have a headache.  You have blurred or double vision.  You have muscle twitching or spasms.  You have shortness of breath.  You have blue fingernails and lips.  You have blood in your urine. MAKE SURE YOU:  Understand these instructions.  Will watch your condition.  Will get help right away if you are not doing well or get worse. Document Released: 04/18/2011 Document Revised: 05/21/2013 Document Reviewed: 02/27/2013 [employer] Patient Information 2014 [employer].  

## 2013-12-25 NOTE — Progress Notes (Signed)
Patient is having increased headaches, blurring vision and upper belly pain.  She has not been feeling well for about two weeks.  She is having some pelvic pressure and that has not changed since her last visit.

## 2013-12-25 NOTE — Progress Notes (Signed)
History of severe preeclampsia; patient recognizes signs.  Headaches are present, no scotomata.  Upper belly pain only after she does some house work or other duties. Headaches not alleviated by Tylenol, Fioricet prescribed. Will check labs today, reevaluate BP in one week. If still elevated, patient will start 2x/weekly testing and be monitored closely for persistent signs of severe preeclampsia.  She understands that persistent severe preeclampsia may necessitate early delivery.  Will watch closely for now, continue ASA as prescribed.  Preeclampsia, fetal movement and labor precautions reviewed.

## 2013-12-26 LAB — COMPREHENSIVE METABOLIC PANEL
ALBUMIN: 3.4 g/dL — AB (ref 3.5–5.2)
ALK PHOS: 83 U/L (ref 39–117)
ALT: <8 U/L (ref 0–35)
AST: 9 U/L (ref 0–37)
BUN: 5 mg/dL — AB (ref 6–23)
CO2: 23 meq/L (ref 19–32)
Calcium: 9 mg/dL (ref 8.4–10.5)
Chloride: 105 mEq/L (ref 96–112)
Creat: 0.49 mg/dL — ABNORMAL LOW (ref 0.50–1.10)
GLUCOSE: 72 mg/dL (ref 70–99)
Potassium: 3.8 mEq/L (ref 3.5–5.3)
Sodium: 138 mEq/L (ref 135–145)
TOTAL PROTEIN: 6 g/dL (ref 6.0–8.3)
Total Bilirubin: 0.4 mg/dL (ref 0.2–1.2)

## 2013-12-26 LAB — PROTEIN / CREATININE RATIO, URINE
CREATININE, URINE: 128.2 mg/dL
PROTEIN CREATININE RATIO: 0.07 (ref <0.15)
TOTAL PROTEIN, URINE: 9 mg/dL

## 2013-12-31 ENCOUNTER — Ambulatory Visit (INDEPENDENT_AMBULATORY_CARE_PROVIDER_SITE_OTHER): Payer: Medicaid Other | Admitting: Obstetrics & Gynecology

## 2013-12-31 VITALS — BP 126/92 | HR 88 | Wt 182.0 lb

## 2013-12-31 DIAGNOSIS — O139 Gestational [pregnancy-induced] hypertension without significant proteinuria, unspecified trimester: Secondary | ICD-10-CM

## 2013-12-31 DIAGNOSIS — O09299 Supervision of pregnancy with other poor reproductive or obstetric history, unspecified trimester: Secondary | ICD-10-CM

## 2013-12-31 LAB — CBC
HCT: 32 % — ABNORMAL LOW (ref 36.0–46.0)
HEMOGLOBIN: 10.9 g/dL — AB (ref 12.0–15.0)
MCH: 29.9 pg (ref 26.0–34.0)
MCHC: 34.1 g/dL (ref 30.0–36.0)
MCV: 87.7 fL (ref 78.0–100.0)
PLATELETS: 358 10*3/uL (ref 150–400)
RBC: 3.65 MIL/uL — ABNORMAL LOW (ref 3.87–5.11)
RDW: 13.3 % (ref 11.5–15.5)
WBC: 7.5 10*3/uL (ref 4.0–10.5)

## 2013-12-31 LAB — COMPREHENSIVE METABOLIC PANEL
ALBUMIN: 3.4 g/dL — AB (ref 3.5–5.2)
ALK PHOS: 90 U/L (ref 39–117)
AST: 8 U/L (ref 0–37)
BILIRUBIN TOTAL: 0.5 mg/dL (ref 0.2–1.2)
BUN: 5 mg/dL — ABNORMAL LOW (ref 6–23)
CO2: 23 mEq/L (ref 19–32)
Calcium: 9.2 mg/dL (ref 8.4–10.5)
Chloride: 106 mEq/L (ref 96–112)
Creat: 0.48 mg/dL — ABNORMAL LOW (ref 0.50–1.10)
GLUCOSE: 70 mg/dL (ref 70–99)
POTASSIUM: 4.1 meq/L (ref 3.5–5.3)
SODIUM: 138 meq/L (ref 135–145)
TOTAL PROTEIN: 6.4 g/dL (ref 6.0–8.3)

## 2013-12-31 NOTE — Progress Notes (Signed)
Pt has gestation HTN.  BP elevated again today.  Will check labs, 24 hour urine, start 2x week testing, and growth US.  Will monitor closely.  Pt understands signs and symptoms of preeclampsia.

## 2014-01-01 LAB — PROTEIN / CREATININE RATIO, URINE
Creatinine, Urine: 59 mg/dL
Protein Creatinine Ratio: 0.07 (ref <0.15)
TOTAL PROTEIN, URINE: 4 mg/dL

## 2014-01-06 ENCOUNTER — Encounter: Payer: Self-pay | Admitting: Family Medicine

## 2014-01-06 ENCOUNTER — Ambulatory Visit (INDEPENDENT_AMBULATORY_CARE_PROVIDER_SITE_OTHER): Payer: Medicaid Other | Admitting: Family Medicine

## 2014-01-06 ENCOUNTER — Encounter: Payer: Self-pay | Admitting: Obstetrics & Gynecology

## 2014-01-06 ENCOUNTER — Other Ambulatory Visit: Payer: Self-pay | Admitting: Obstetrics & Gynecology

## 2014-01-06 ENCOUNTER — Ambulatory Visit (HOSPITAL_COMMUNITY)
Admission: RE | Admit: 2014-01-06 | Discharge: 2014-01-06 | Disposition: A | Discharge status: Home / Self Care | Payer: Medicaid Other | Source: Ambulatory Visit | Attending: Obstetrics & Gynecology | Admitting: Obstetrics & Gynecology

## 2014-01-06 ENCOUNTER — Ambulatory Visit (HOSPITAL_COMMUNITY): Payer: Medicaid Other

## 2014-01-06 VITALS — BP 127/92 | HR 101 | Wt 187.0 lb

## 2014-01-06 VITALS — BP 123/88 | HR 93 | Wt 187.0 lb

## 2014-01-06 DIAGNOSIS — O10919 Unspecified pre-existing hypertension complicating pregnancy, unspecified trimester: Secondary | ICD-10-CM

## 2014-01-06 DIAGNOSIS — Z3480 Encounter for supervision of other normal pregnancy, unspecified trimester: Secondary | ICD-10-CM

## 2014-01-06 DIAGNOSIS — O139 Gestational [pregnancy-induced] hypertension without significant proteinuria, unspecified trimester: Secondary | ICD-10-CM | POA: Insufficient documentation

## 2014-01-06 DIAGNOSIS — O133 Gestational [pregnancy-induced] hypertension without significant proteinuria, third trimester: Secondary | ICD-10-CM | POA: Insufficient documentation

## 2014-01-06 DIAGNOSIS — IMO0002 Reserved for concepts with insufficient information to code with codable children: Secondary | ICD-10-CM

## 2014-01-06 DIAGNOSIS — O09299 Supervision of pregnancy with other poor reproductive or obstetric history, unspecified trimester: Secondary | ICD-10-CM | POA: Insufficient documentation

## 2014-01-06 DIAGNOSIS — Z302 Encounter for sterilization: Secondary | ICD-10-CM

## 2014-01-06 LAB — CBC
HCT: 31.3 % — ABNORMAL LOW (ref 36.0–46.0)
HEMOGLOBIN: 10.8 g/dL — AB (ref 12.0–15.0)
MCH: 29.8 pg (ref 26.0–34.0)
MCHC: 34.5 g/dL (ref 30.0–36.0)
MCV: 86.5 fL (ref 78.0–100.0)
Platelets: 356 10*3/uL (ref 150–400)
RBC: 3.62 MIL/uL — ABNORMAL LOW (ref 3.87–5.11)
RDW: 13.2 % (ref 11.5–15.5)
WBC: 9.2 10*3/uL (ref 4.0–10.5)

## 2014-01-06 MED ORDER — BLOOD PRESSURE MONITORING DEVI: 1.0000 [IU] | Status: DC | PRN

## 2014-01-06 NOTE — Progress Notes (Signed)
Normal growth 29 % with AC at 9%,nml fluid-12.48, dopplers are WNL although at high end.  NRNST today--with normal BPP (8/8).  Seeing MFM--recommend delivery at 37 wks, weekly labs--will get today. Has appointment here on Friday for NST and will bring 24 hour urine with her. Home blood pressure monitor given. S/Sx's of pre-eclampsia reviewed. Timing of delivery reviewed. Will see pt. 2x/wk.

## 2014-01-06 NOTE — Progress Notes (Signed)
Patient is still having headaches, she forgot her 24 hour urine sample at home today.  She is interested in getting a blood pressure monitor prescribed so that she doesn't have to keep running to the store to check her pressures when she is at home.  She is concerned about the increase in weight as well.  She has had a significant increase in swelling as well.

## 2014-01-06 NOTE — Progress Notes (Signed)
[facility] ultrasound  Indication: 23 yr old [id number] at [redacted]w[redacted]d with gestational hypertension, history of preeclampsia, and previous fetal demise for growth and BPP secondary to nonreactive NST.  Findings: 1. Single intrauterine pregnancy. 2. Estimated fetal weight is in the 29th%. The abdominal circumference is in the 9th%. 3. Posterior placenta without evidence of previa. 4. Normal amniotic fluid index. 5. The limited anatomy survey is normal. 6. Normal umbilical artery Doppler studies; although systolic/diastolic ratio is at the top end of normal. 7. Normal biophysical profile of 8/8.  Recommendations: 1. Overall appropriate fetal growth; lagging abdominal circumference: - normal Dopplers and AFI however given Dopplers at top end of normal recommend repeat in 1 week - repeat fetal growth in 2 weeks 2. Gestational hypertension/history of preeclampsia: - on low dose aspirin - recommend check weekly AST, ALT, CBC, creatinine - recommend delivery at 37 weeks or sooner for severe disease - recommend continue antenatal testing (BPP 8/10 today) and fetal growth as above - monitor closely for signs/symptoms of severe preeclampsia/severe gestational hypertension 3. History of fetal demise: - surveillance as above - recommend fetal kick counts  Eulis Foster, MD

## 2014-01-06 NOTE — Patient Instructions (Signed)
Preeclampsia and Eclampsia Preeclampsia is a condition of high blood pressure during pregnancy. It can happen at 20 weeks or later in pregnancy. If high blood pressure occurs in the second half of pregnancy with no other symptoms, it is called gestational hypertension and goes away after the baby is born. If any of the symptoms listed below develop with gestational hypertension, it is then called preeclampsia. Eclampsia (convulsions) may follow preeclampsia. This is one of the reasons for regular prenatal checkups. Early diagnosis and treatment are very important to prevent eclampsia. CAUSES  There is no known cause of preeclampsia/eclampsia in pregnancy. There are several known conditions that may put the pregnant woman at risk, such as:  The first pregnancy.  Having preeclampsia in a past pregnancy.  Having lasting (chronic) high blood pressure.  Having multiples (twins, triplets).  Being age 35 or older.  African American ethnic background.  Having kidney disease or diabetes.  Medical conditions such as lupus or blood diseases.  Being overweight (obese). SYMPTOMS   High blood pressure.  Headaches.  Sudden weight gain.  Swelling of hands, face, legs, and feet.  Protein in the urine.  Feeling sick to your stomach (nauseous) and throwing up (vomiting).  Vision problems (blurred or double vision).  Numbness in the face, arms, legs, and feet.  Dizziness.  Slurred speech.  Preeclampsia can cause growth retardation in the fetus.  Separation (abruption) of the placenta.  Not enough fluid in the amniotic sac (oligohydramnios).  Sensitivity to bright lights.  Belly (abdominal) pain. DIAGNOSIS  If protein is found in the urine in the second half of pregnancy, this is considered preeclampsia. Other symptoms mentioned above may also be present. TREATMENT  It is necessary to treat this.  Your caregiver may prescribe bed rest early in this condition. Plenty of rest and  salt restriction may be all that is needed.  Medicines may be necessary to lower blood pressure if the condition does not respond to more conservative measures.  In more severe cases, hospitalization may be needed:  For treatment of blood pressure.  To control fluid retention.  To monitor the baby to see if the condition is causing harm to the baby.  Hospitalization is the best way to treat the first sign of preeclampsia. This is so the mother and baby can be watched closely and blood tests can be done effectively and correctly.  If the condition becomes severe, it may be necessary to induce labor or to remove the infant by surgical means (cesarean section). The best cure for preeclampsia/eclampsia is to deliver the baby. Preeclampsia and eclampsia involve risks to mother and infant. Your caregiver will discuss these risks with you. Together, you can work out the best possible approach to your problems. Make sure you keep your prenatal visits as scheduled. Not keeping appointments could result in a chronic or permanent injury, pain, disability to you, and death or injury to you or your unborn baby. If there is any problem keeping the appointment, you must call to reschedule. HOME CARE INSTRUCTIONS   Keep your prenatal appointments and tests as scheduled.  Tell your caregiver if you have any of the above risk factors.  Get plenty of rest and sleep.  Eat a balanced diet that is low in salt, and do not add salt to your food.  Avoid stressful situations.  Only take over-the-counter and prescriptions medicines for pain, discomfort, or fever as directed by your caregiver. SEEK IMMEDIATE MEDICAL CARE IF:   You develop severe swelling   anywhere in the body. This usually occurs in the legs.  You gain 05 lb/2.3 kg or more in a week.  You develop a severe headache, dizziness, problems with your vision, or confusion.  You have abdominal pain, nausea, or vomiting.  You have a seizure.  You  have trouble moving any part of your body, or you develop numbness or problems speaking.  You have bruising or abnormal bleeding from anywhere in the body.  You develop a stiff neck.  You pass out. MAKE SURE YOU:   Understand these instructions.  Will watch your condition.  Will get help right away if you are not doing well or get worse. Document Released: 07/28/2000 Document Revised: 10/23/2011 Document Reviewed: 03/13/2008 [employer] Patient Information 2014 [employer].  Breastfeeding Deciding to breastfeed is one of the best choices you can make for you and your baby. A change in hormones during pregnancy causes your breast tissue to grow and increases the number and size of your milk ducts. These hormones also allow proteins, sugars, and fats from your blood supply to make breast milk in your milk-producing glands. Hormones prevent breast milk from being released before your baby is born as well as prompt milk flow after birth. Once breastfeeding has begun, thoughts of your baby, as well as his or her sucking or crying, can stimulate the release of milk from your milk-producing glands.  BENEFITS OF BREASTFEEDING For Your Baby  Your first milk (colostrum) helps your baby's digestive system function better.   There are antibodies in your milk that help your baby fight off infections.   Your baby has a lower incidence of asthma, allergies, and sudden infant death syndrome.   The nutrients in breast milk are better for your baby than infant formulas and are designed uniquely for your baby's needs.   Breast milk improves your baby's brain development.   Your baby is less likely to develop other conditions, such as childhood obesity, asthma, or type 2 diabetes mellitus.  For You   Breastfeeding helps to create a very special bond between you and your baby.   Breastfeeding is convenient. Breast milk is always available at the correct temperature and costs nothing.    Breastfeeding helps to burn calories and helps you lose the weight gained during pregnancy.   Breastfeeding makes your uterus contract to its prepregnancy size faster and slows bleeding (lochia) after you give birth.   Breastfeeding helps to lower your risk of developing type 2 diabetes mellitus, osteoporosis, and breast or ovarian cancer later in life. SIGNS THAT YOUR BABY IS HUNGRY Early Signs of Hunger  Increased alertness or activity.  Stretching.  Movement of the head from side to side.  Movement of the head and opening of the mouth when the corner of the mouth or cheek is stroked (rooting).  Increased sucking sounds, smacking lips, cooing, sighing, or squeaking.  Hand-to-mouth movements.  Increased sucking of fingers or hands. Late Signs of Hunger  Fussing.  Intermittent crying. Extreme Signs of Hunger Signs of extreme hunger will require calming and consoling before your baby will be able to breastfeed successfully. Do not wait for the following signs of extreme hunger to occur before you initiate breastfeeding:   Restlessness.  A loud, strong cry.   Screaming. BREASTFEEDING BASICS Breastfeeding Initiation  Find a comfortable place to sit or lie down, with your neck and back well supported.  Place a pillow or rolled up blanket under your baby to bring him or her to the   level of your breast (if you are seated). Nursing pillows are specially designed to help support your arms and your baby while you breastfeed.  Make sure that your baby's abdomen is facing your abdomen.   Gently massage your breast. With your fingertips, massage from your chest wall toward your nipple in a circular motion. This encourages milk flow. You may need to continue this action during the feeding if your milk flows slowly.  Support your breast with 4 fingers underneath and your thumb above your nipple. Make sure your fingers are well away from your nipple and your baby's mouth.    Stroke your baby's lips gently with your finger or nipple.   When your baby's mouth is open wide enough, quickly bring your baby to your breast, placing your entire nipple and as much of the colored area around your nipple (areola) as possible into your baby's mouth.   More areola should be visible above your baby's upper lip than below the lower lip.   Your baby's tongue should be between his or her lower gum and your breast.   Ensure that your baby's mouth is correctly positioned around your nipple (latched). Your baby's lips should create a seal on your breast and be turned out (everted).  It is common for your baby to suck about 2 3 minutes in order to start the flow of breast milk. Latching Teaching your baby how to latch on to your breast properly is very important. An improper latch can cause nipple pain and decreased milk supply for you and poor weight gain in your baby. Also, if your baby is not latched onto your nipple properly, he or she may swallow some air during feeding. This can make your baby fussy. Burping your baby when you switch breasts during the feeding can help to get rid of the air. However, teaching your baby to latch on properly is still the best way to prevent fussiness from swallowing air while breastfeeding. Signs that your baby has successfully latched on to your nipple:    Silent tugging or silent sucking, without causing you pain.   Swallowing heard between every 3 4 sucks.    Muscle movement above and in front of his or her ears while sucking.  Signs that your baby has not successfully latched on to nipple:   Sucking sounds or smacking sounds from your baby while breastfeeding.  Nipple pain. If you think your baby has not latched on correctly, slip your finger into the corner of your baby's mouth to break the suction and place it between your baby's gums. Attempt breastfeeding initiation again. Signs of Successful Breastfeeding Signs from your  baby:   A gradual decrease in the number of sucks or complete cessation of sucking.   Falling asleep.   Relaxation of his or her body.   Retention of a small amount of milk in his or her mouth.   Letting go of your breast by himself or herself. Signs from you:  Breasts that have increased in firmness, weight, and size 1 3 hours after feeding.   Breasts that are softer immediately after breastfeeding.  Increased milk volume, as well as a change in milk consistency and color by the 5th day of breastfeeding.   Nipples that are not sore, cracked, or bleeding. Signs That Your Baby is Getting Enough Milk  Wetting at least 3 diapers in a 24-hour period. The urine should be clear and pale yellow by age 5 days.  At least 3   stools in a 24-hour period by age 5 days. The stool should be soft and yellow.  At least 3 stools in a 24-hour period by age 7 days. The stool should be seedy and yellow.  No loss of weight greater than 10% of birth weight during the first 3 days of age.  Average weight gain of 4 7 ounces (120 210 mL) per week after age 4 days.  Consistent daily weight gain by age 5 days, without weight loss after the age of 2 weeks. After a feeding, your baby may spit up a small amount. This is common. BREASTFEEDING FREQUENCY AND DURATION Frequent feeding will help you make more milk and can prevent sore nipples and breast engorgement. Breastfeed when you feel the need to reduce the fullness of your breasts or when your baby shows signs of hunger. This is called "breastfeeding on demand." Avoid introducing a pacifier to your baby while you are working to establish breastfeeding (the first 4 6 weeks after your baby is born). After this time you may choose to use a pacifier. Research has shown that pacifier use during the first year of a baby's life decreases the risk of sudden infant death syndrome (SIDS). Allow your baby to feed on each breast as long as he or she wants.  Breastfeed until your baby is finished feeding. When your baby unlatches or falls asleep while feeding from the first breast, offer the second breast. Because newborns are often sleepy in the first few weeks of life, you may need to awaken your baby to get him or her to feed. Breastfeeding times will vary from baby to baby. However, the following rules can serve as a guide to help you ensure that your baby is properly fed:  Newborns (babies 4 weeks of age or younger) may breastfeed every 1 3 hours.  Newborns should not go longer than 3 hours during the day or 5 hours during the night without breastfeeding.  You should breastfeed your baby a minimum of 8 times in a 24-hour period until you begin to introduce solid foods to your baby at around 6 months of age. BREAST MILK PUMPING Pumping and storing breast milk allows you to ensure that your baby is exclusively fed your breast milk, even at times when you are unable to breastfeed. This is especially important if you are going back to work while you are still breastfeeding or when you are not able to be present during feedings. Your lactation consultant can give you guidelines on how long it is safe to store breast milk.  A breast pump is a machine that allows you to pump milk from your breast into a sterile bottle. The pumped breast milk can then be stored in a refrigerator or freezer. Some breast pumps are operated by hand, while others use electricity. Ask your lactation consultant which type will work best for you. Breast pumps can be purchased, but some hospitals and breastfeeding support groups lease breast pumps on a monthly basis. A lactation consultant can teach you how to hand express breast milk, if you prefer not to use a pump.  CARING FOR YOUR BREASTS WHILE YOU BREASTFEED Nipples can become dry, cracked, and sore while breastfeeding. The following recommendations can help keep your breasts moisturized and healthy:  Avoid using soap on your  nipples.   Wear a supportive bra. Although not required, special nursing bras and tank tops are designed to allow access to your breasts for breastfeeding without taking off your entire   bra or top. Avoid wearing underwire style bras or extremely tight bras.  Air dry your nipples for 3 4minutes after each feeding.   Use only cotton bra pads to absorb leaked breast milk. Leaking of breast milk between feedings is normal.   Use lanolin on your nipples after breastfeeding. Lanolin helps to maintain your skin's normal moisture barrier. If you use pure lanolin you do not need to wash it off before feeding your baby again. Pure lanolin is not toxic to your baby. You may also hand express a few drops of breast milk and gently massage that milk into your nipples and allow the milk to air dry. In the first few weeks after giving birth, some women experience extremely full breasts (engorgement). Engorgement can make your breasts feel heavy, warm, and tender to the touch. Engorgement peaks within 3 5 days after you give birth. The following recommendations can help ease engorgement:  Completely empty your breasts while breastfeeding or pumping. You may want to start by applying warm, moist heat (in the shower or with warm water-soaked hand towels) just before feeding or pumping. This increases circulation and helps the milk flow. If your baby does not completely empty your breasts while breastfeeding, pump any extra milk after he or she is finished.  Wear a snug bra (nursing or regular) or tank top for 1 2 days to signal your body to slightly decrease milk production.  Apply ice packs to your breasts, unless this is too uncomfortable for you.  Make sure that your baby is latched on and positioned properly while breastfeeding. If engorgement persists after 48 hours of following these recommendations, contact your health care provider or a lactation consultant. OVERALL HEALTH CARE RECOMMENDATIONS WHILE  BREASTFEEDING  Eat healthy foods. Alternate between meals and snacks, eating 3 of each per day. Because what you eat affects your breast milk, some of the foods may make your baby more irritable than usual. Avoid eating these foods if you are sure that they are negatively affecting your baby.  Drink milk, fruit juice, and water to satisfy your thirst (about 10 glasses a day).   Rest often, relax, and continue to take your prenatal vitamins to prevent fatigue, stress, and anemia.  Continue breast self-awareness checks.  Avoid chewing and smoking tobacco.  Avoid alcohol and drug use. Some medicines that may be harmful to your baby can pass through breast milk. It is important to ask your health care provider before taking any medicine, including all over-the-counter and prescription medicine as well as vitamin and herbal supplements. It is possible to become pregnant while breastfeeding. If birth control is desired, ask your health care provider about options that will be safe for your baby. SEEK MEDICAL CARE IF:   You feel like you want to stop breastfeeding or have become frustrated with breastfeeding.  You have painful breasts or nipples.  Your nipples are cracked or bleeding.  Your breasts are red, tender, or warm.  You have a swollen area on either breast.  You have a fever or chills.  You have nausea or vomiting.  You have drainage other than breast milk from your nipples.  Your breasts do not become full before feedings by the 5th day after you give birth.  You feel sad and depressed.  Your baby is too sleepy to eat well.  Your baby is having trouble sleeping.   Your baby is wetting less than 3 diapers in a 24-hour period.  Your baby has less than   3 stools in a 24-hour period.  Your baby's skin or the white part of his or her eyes becomes yellow.   Your baby is not gaining weight by 5 days of age. SEEK IMMEDIATE MEDICAL CARE IF:   Your baby is overly tired  (lethargic) and does not want to wake up and feed.  Your baby develops an unexplained fever. Document Released: 07/31/2005 Document Revised: 04/02/2013 Document Reviewed: 01/22/2013 [employer] Patient Information 2014 [employer].  

## 2014-01-07 LAB — COMPREHENSIVE METABOLIC PANEL
ALT: 8 U/L (ref 0–35)
AST: 9 U/L (ref 0–37)
Albumin: 3.2 g/dL — ABNORMAL LOW (ref 3.5–5.2)
Alkaline Phosphatase: 89 U/L (ref 39–117)
BILIRUBIN TOTAL: 0.3 mg/dL (ref 0.2–1.2)
BUN: 6 mg/dL (ref 6–23)
CO2: 22 meq/L (ref 19–32)
Calcium: 9.1 mg/dL (ref 8.4–10.5)
Chloride: 104 mEq/L (ref 96–112)
Creat: 0.43 mg/dL — ABNORMAL LOW (ref 0.50–1.10)
GLUCOSE: 89 mg/dL (ref 70–99)
Potassium: 3.8 mEq/L (ref 3.5–5.3)
Sodium: 137 mEq/L (ref 135–145)
TOTAL PROTEIN: 5.9 g/dL — AB (ref 6.0–8.3)

## 2014-01-07 LAB — PROTEIN / CREATININE RATIO, URINE
CREATININE, URINE: 63.4 mg/dL
Protein Creatinine Ratio: 0.06 (ref <0.15)
Total Protein, Urine: 4 mg/dL

## 2014-01-09 ENCOUNTER — Ambulatory Visit (INDEPENDENT_AMBULATORY_CARE_PROVIDER_SITE_OTHER): Payer: Medicaid Other | Admitting: Family Medicine

## 2014-01-09 VITALS — BP 128/96 | HR 93 | Wt 186.0 lb

## 2014-01-09 DIAGNOSIS — Z3480 Encounter for supervision of other normal pregnancy, unspecified trimester: Secondary | ICD-10-CM

## 2014-01-09 DIAGNOSIS — O133 Gestational [pregnancy-induced] hypertension without significant proteinuria, third trimester: Secondary | ICD-10-CM

## 2014-01-09 DIAGNOSIS — O139 Gestational [pregnancy-induced] hypertension without significant proteinuria, unspecified trimester: Secondary | ICD-10-CM

## 2014-01-09 NOTE — Progress Notes (Signed)
NST reviewed and reactive. Labs from Tuesday reviewed and WNL. MFM weekly Normal labs this week Will see MFM on Tuesday for u/s. F/u here on Friday for NST and labs. Explained severe features that would prompt delivery at 34 wks or greater. Highest Blood pressure readings at [facility] is 139/100.

## 2014-01-09 NOTE — Addendum Note (Signed)
Addended by: Barbara Cower on: 01/09/2014 11:58 AM   Modules accepted: Orders

## 2014-01-09 NOTE — Patient Instructions (Signed)
Preeclampsia and Eclampsia Preeclampsia is a condition of high blood pressure during pregnancy. It can happen at 20 weeks or later in pregnancy. If high blood pressure occurs in the second half of pregnancy with no other symptoms, it is called gestational hypertension and goes away after the baby is born. If any of the symptoms listed below develop with gestational hypertension, it is then called preeclampsia. Eclampsia (convulsions) may follow preeclampsia. This is one of the reasons for regular prenatal checkups. Early diagnosis and treatment are very important to prevent eclampsia. CAUSES  There is no known cause of preeclampsia/eclampsia in pregnancy. There are several known conditions that may put the pregnant woman at risk, such as:  The first pregnancy.  Having preeclampsia in a past pregnancy.  Having lasting (chronic) high blood pressure.  Having multiples (twins, triplets).  Being age 35 or older.  African American ethnic background.  Having kidney disease or diabetes.  Medical conditions such as lupus or blood diseases.  Being overweight (obese). SYMPTOMS   High blood pressure.  Headaches.  Sudden weight gain.  Swelling of hands, face, legs, and feet.  Protein in the urine.  Feeling sick to your stomach (nauseous) and throwing up (vomiting).  Vision problems (blurred or double vision).  Numbness in the face, arms, legs, and feet.  Dizziness.  Slurred speech.  Preeclampsia can cause growth retardation in the fetus.  Separation (abruption) of the placenta.  Not enough fluid in the amniotic sac (oligohydramnios).  Sensitivity to bright lights.  Belly (abdominal) pain. DIAGNOSIS  If protein is found in the urine in the second half of pregnancy, this is considered preeclampsia. Other symptoms mentioned above may also be present. TREATMENT  It is necessary to treat this.  Your caregiver may prescribe bed rest early in this condition. Plenty of rest and  salt restriction may be all that is needed.  Medicines may be necessary to lower blood pressure if the condition does not respond to more conservative measures.  In more severe cases, hospitalization may be needed:  For treatment of blood pressure.  To control fluid retention.  To monitor the baby to see if the condition is causing harm to the baby.  Hospitalization is the best way to treat the first sign of preeclampsia. This is so the mother and baby can be watched closely and blood tests can be done effectively and correctly.  If the condition becomes severe, it may be necessary to induce labor or to remove the infant by surgical means (cesarean section). The best cure for preeclampsia/eclampsia is to deliver the baby. Preeclampsia and eclampsia involve risks to mother and infant. Your caregiver will discuss these risks with you. Together, you can work out the best possible approach to your problems. Make sure you keep your prenatal visits as scheduled. Not keeping appointments could result in a chronic or permanent injury, pain, disability to you, and death or injury to you or your unborn baby. If there is any problem keeping the appointment, you must call to reschedule. HOME CARE INSTRUCTIONS   Keep your prenatal appointments and tests as scheduled.  Tell your caregiver if you have any of the above risk factors.  Get plenty of rest and sleep.  Eat a balanced diet that is low in salt, and do not add salt to your food.  Avoid stressful situations.  Only take over-the-counter and prescriptions medicines for pain, discomfort, or fever as directed by your caregiver. SEEK IMMEDIATE MEDICAL CARE IF:   You develop severe swelling   anywhere in the body. This usually occurs in the legs.  You gain 05 lb/2.3 kg or more in a week.  You develop a severe headache, dizziness, problems with your vision, or confusion.  You have abdominal pain, nausea, or vomiting.  You have a seizure.  You  have trouble moving any part of your body, or you develop numbness or problems speaking.  You have bruising or abnormal bleeding from anywhere in the body.  You develop a stiff neck.  You pass out. MAKE SURE YOU:   Understand these instructions.  Will watch your condition.  Will get help right away if you are not doing well or get worse. Document Released: 07/28/2000 Document Revised: 10/23/2011 Document Reviewed: 03/13/2008 [employer] Patient Information 2014 [employer].  Breastfeeding Deciding to breastfeed is one of the best choices you can make for you and your baby. A change in hormones during pregnancy causes your breast tissue to grow and increases the number and size of your milk ducts. These hormones also allow proteins, sugars, and fats from your blood supply to make breast milk in your milk-producing glands. Hormones prevent breast milk from being released before your baby is born as well as prompt milk flow after birth. Once breastfeeding has begun, thoughts of your baby, as well as his or her sucking or crying, can stimulate the release of milk from your milk-producing glands.  BENEFITS OF BREASTFEEDING For Your Baby  Your first milk (colostrum) helps your baby's digestive system function better.   There are antibodies in your milk that help your baby fight off infections.   Your baby has a lower incidence of asthma, allergies, and sudden infant death syndrome.   The nutrients in breast milk are better for your baby than infant formulas and are designed uniquely for your baby's needs.   Breast milk improves your baby's brain development.   Your baby is less likely to develop other conditions, such as childhood obesity, asthma, or type 2 diabetes mellitus.  For You   Breastfeeding helps to create a very special bond between you and your baby.   Breastfeeding is convenient. Breast milk is always available at the correct temperature and costs nothing.    Breastfeeding helps to burn calories and helps you lose the weight gained during pregnancy.   Breastfeeding makes your uterus contract to its prepregnancy size faster and slows bleeding (lochia) after you give birth.   Breastfeeding helps to lower your risk of developing type 2 diabetes mellitus, osteoporosis, and breast or ovarian cancer later in life. SIGNS THAT YOUR BABY IS HUNGRY Early Signs of Hunger  Increased alertness or activity.  Stretching.  Movement of the head from side to side.  Movement of the head and opening of the mouth when the corner of the mouth or cheek is stroked (rooting).  Increased sucking sounds, smacking lips, cooing, sighing, or squeaking.  Hand-to-mouth movements.  Increased sucking of fingers or hands. Late Signs of Hunger  Fussing.  Intermittent crying. Extreme Signs of Hunger Signs of extreme hunger will require calming and consoling before your baby will be able to breastfeed successfully. Do not wait for the following signs of extreme hunger to occur before you initiate breastfeeding:   Restlessness.  A loud, strong cry.   Screaming. BREASTFEEDING BASICS Breastfeeding Initiation  Find a comfortable place to sit or lie down, with your neck and back well supported.  Place a pillow or rolled up blanket under your baby to bring him or her to the   level of your breast (if you are seated). Nursing pillows are specially designed to help support your arms and your baby while you breastfeed.  Make sure that your baby's abdomen is facing your abdomen.   Gently massage your breast. With your fingertips, massage from your chest wall toward your nipple in a circular motion. This encourages milk flow. You may need to continue this action during the feeding if your milk flows slowly.  Support your breast with 4 fingers underneath and your thumb above your nipple. Make sure your fingers are well away from your nipple and your baby's mouth.    Stroke your baby's lips gently with your finger or nipple.   When your baby's mouth is open wide enough, quickly bring your baby to your breast, placing your entire nipple and as much of the colored area around your nipple (areola) as possible into your baby's mouth.   More areola should be visible above your baby's upper lip than below the lower lip.   Your baby's tongue should be between his or her lower gum and your breast.   Ensure that your baby's mouth is correctly positioned around your nipple (latched). Your baby's lips should create a seal on your breast and be turned out (everted).  It is common for your baby to suck about 2 3 minutes in order to start the flow of breast milk. Latching Teaching your baby how to latch on to your breast properly is very important. An improper latch can cause nipple pain and decreased milk supply for you and poor weight gain in your baby. Also, if your baby is not latched onto your nipple properly, he or she may swallow some air during feeding. This can make your baby fussy. Burping your baby when you switch breasts during the feeding can help to get rid of the air. However, teaching your baby to latch on properly is still the best way to prevent fussiness from swallowing air while breastfeeding. Signs that your baby has successfully latched on to your nipple:    Silent tugging or silent sucking, without causing you pain.   Swallowing heard between every 3 4 sucks.    Muscle movement above and in front of his or her ears while sucking.  Signs that your baby has not successfully latched on to nipple:   Sucking sounds or smacking sounds from your baby while breastfeeding.  Nipple pain. If you think your baby has not latched on correctly, slip your finger into the corner of your baby's mouth to break the suction and place it between your baby's gums. Attempt breastfeeding initiation again. Signs of Successful Breastfeeding Signs from your  baby:   A gradual decrease in the number of sucks or complete cessation of sucking.   Falling asleep.   Relaxation of his or her body.   Retention of a small amount of milk in his or her mouth.   Letting go of your breast by himself or herself. Signs from you:  Breasts that have increased in firmness, weight, and size 1 3 hours after feeding.   Breasts that are softer immediately after breastfeeding.  Increased milk volume, as well as a change in milk consistency and color by the 5th day of breastfeeding.   Nipples that are not sore, cracked, or bleeding. Signs That Your Baby is Getting Enough Milk  Wetting at least 3 diapers in a 24-hour period. The urine should be clear and pale yellow by age 5 days.  At least 3   stools in a 24-hour period by age 5 days. The stool should be soft and yellow.  At least 3 stools in a 24-hour period by age 7 days. The stool should be seedy and yellow.  No loss of weight greater than 10% of birth weight during the first 3 days of age.  Average weight gain of 4 7 ounces (120 210 mL) per week after age 4 days.  Consistent daily weight gain by age 5 days, without weight loss after the age of 2 weeks. After a feeding, your baby may spit up a small amount. This is common. BREASTFEEDING FREQUENCY AND DURATION Frequent feeding will help you make more milk and can prevent sore nipples and breast engorgement. Breastfeed when you feel the need to reduce the fullness of your breasts or when your baby shows signs of hunger. This is called "breastfeeding on demand." Avoid introducing a pacifier to your baby while you are working to establish breastfeeding (the first 4 6 weeks after your baby is born). After this time you may choose to use a pacifier. Research has shown that pacifier use during the first year of a baby's life decreases the risk of sudden infant death syndrome (SIDS). Allow your baby to feed on each breast as long as he or she wants.  Breastfeed until your baby is finished feeding. When your baby unlatches or falls asleep while feeding from the first breast, offer the second breast. Because newborns are often sleepy in the first few weeks of life, you may need to awaken your baby to get him or her to feed. Breastfeeding times will vary from baby to baby. However, the following rules can serve as a guide to help you ensure that your baby is properly fed:  Newborns (babies 4 weeks of age or younger) may breastfeed every 1 3 hours.  Newborns should not go longer than 3 hours during the day or 5 hours during the night without breastfeeding.  You should breastfeed your baby a minimum of 8 times in a 24-hour period until you begin to introduce solid foods to your baby at around 6 months of age. BREAST MILK PUMPING Pumping and storing breast milk allows you to ensure that your baby is exclusively fed your breast milk, even at times when you are unable to breastfeed. This is especially important if you are going back to work while you are still breastfeeding or when you are not able to be present during feedings. Your lactation consultant can give you guidelines on how long it is safe to store breast milk.  A breast pump is a machine that allows you to pump milk from your breast into a sterile bottle. The pumped breast milk can then be stored in a refrigerator or freezer. Some breast pumps are operated by hand, while others use electricity. Ask your lactation consultant which type will work best for you. Breast pumps can be purchased, but some hospitals and breastfeeding support groups lease breast pumps on a monthly basis. A lactation consultant can teach you how to hand express breast milk, if you prefer not to use a pump.  CARING FOR YOUR BREASTS WHILE YOU BREASTFEED Nipples can become dry, cracked, and sore while breastfeeding. The following recommendations can help keep your breasts moisturized and healthy:  Avoid using soap on your  nipples.   Wear a supportive bra. Although not required, special nursing bras and tank tops are designed to allow access to your breasts for breastfeeding without taking off your entire   bra or top. Avoid wearing underwire style bras or extremely tight bras.  Air dry your nipples for 3 4minutes after each feeding.   Use only cotton bra pads to absorb leaked breast milk. Leaking of breast milk between feedings is normal.   Use lanolin on your nipples after breastfeeding. Lanolin helps to maintain your skin's normal moisture barrier. If you use pure lanolin you do not need to wash it off before feeding your baby again. Pure lanolin is not toxic to your baby. You may also hand express a few drops of breast milk and gently massage that milk into your nipples and allow the milk to air dry. In the first few weeks after giving birth, some women experience extremely full breasts (engorgement). Engorgement can make your breasts feel heavy, warm, and tender to the touch. Engorgement peaks within 3 5 days after you give birth. The following recommendations can help ease engorgement:  Completely empty your breasts while breastfeeding or pumping. You may want to start by applying warm, moist heat (in the shower or with warm water-soaked hand towels) just before feeding or pumping. This increases circulation and helps the milk flow. If your baby does not completely empty your breasts while breastfeeding, pump any extra milk after he or she is finished.  Wear a snug bra (nursing or regular) or tank top for 1 2 days to signal your body to slightly decrease milk production.  Apply ice packs to your breasts, unless this is too uncomfortable for you.  Make sure that your baby is latched on and positioned properly while breastfeeding. If engorgement persists after 48 hours of following these recommendations, contact your health care provider or a lactation consultant. OVERALL HEALTH CARE RECOMMENDATIONS WHILE  BREASTFEEDING  Eat healthy foods. Alternate between meals and snacks, eating 3 of each per day. Because what you eat affects your breast milk, some of the foods may make your baby more irritable than usual. Avoid eating these foods if you are sure that they are negatively affecting your baby.  Drink milk, fruit juice, and water to satisfy your thirst (about 10 glasses a day).   Rest often, relax, and continue to take your prenatal vitamins to prevent fatigue, stress, and anemia.  Continue breast self-awareness checks.  Avoid chewing and smoking tobacco.  Avoid alcohol and drug use. Some medicines that may be harmful to your baby can pass through breast milk. It is important to ask your health care provider before taking any medicine, including all over-the-counter and prescription medicine as well as vitamin and herbal supplements. It is possible to become pregnant while breastfeeding. If birth control is desired, ask your health care provider about options that will be safe for your baby. SEEK MEDICAL CARE IF:   You feel like you want to stop breastfeeding or have become frustrated with breastfeeding.  You have painful breasts or nipples.  Your nipples are cracked or bleeding.  Your breasts are red, tender, or warm.  You have a swollen area on either breast.  You have a fever or chills.  You have nausea or vomiting.  You have drainage other than breast milk from your nipples.  Your breasts do not become full before feedings by the 5th day after you give birth.  You feel sad and depressed.  Your baby is too sleepy to eat well.  Your baby is having trouble sleeping.   Your baby is wetting less than 3 diapers in a 24-hour period.  Your baby has less than   3 stools in a 24-hour period.  Your baby's skin or the white part of his or her eyes becomes yellow.   Your baby is not gaining weight by 5 days of age. SEEK IMMEDIATE MEDICAL CARE IF:   Your baby is overly tired  (lethargic) and does not want to wake up and feed.  Your baby develops an unexplained fever. Document Released: 07/31/2005 Document Revised: 04/02/2013 Document Reviewed: 01/22/2013 [employer] Patient Information 2014 [employer].  

## 2014-01-13 ENCOUNTER — Ambulatory Visit (HOSPITAL_COMMUNITY)
Admission: RE | Admit: 2014-01-13 | Discharge: 2014-01-13 | Disposition: A | Discharge status: Home / Self Care | Payer: Medicaid Other | Source: Ambulatory Visit | Attending: Obstetrics & Gynecology | Admitting: Obstetrics & Gynecology

## 2014-01-13 VITALS — BP 127/81 | HR 93

## 2014-01-13 DIAGNOSIS — O133 Gestational [pregnancy-induced] hypertension without significant proteinuria, third trimester: Secondary | ICD-10-CM

## 2014-01-13 DIAGNOSIS — O09299 Supervision of pregnancy with other poor reproductive or obstetric history, unspecified trimester: Secondary | ICD-10-CM

## 2014-01-13 DIAGNOSIS — O10019 Pre-existing essential hypertension complicating pregnancy, unspecified trimester: Secondary | ICD-10-CM | POA: Insufficient documentation

## 2014-01-13 DIAGNOSIS — Z302 Encounter for sterilization: Secondary | ICD-10-CM

## 2014-01-13 DIAGNOSIS — O10919 Unspecified pre-existing hypertension complicating pregnancy, unspecified trimester: Secondary | ICD-10-CM

## 2014-01-13 DIAGNOSIS — O365990 Maternal care for other known or suspected poor fetal growth, unspecified trimester, not applicable or unspecified: Secondary | ICD-10-CM | POA: Insufficient documentation

## 2014-01-13 DIAGNOSIS — IMO0002 Reserved for concepts with insufficient information to code with codable children: Secondary | ICD-10-CM

## 2014-01-13 LAB — PROTEIN, URINE, 24 HOUR
PROTEIN, URINE: 11 mg/dL
Protein, 24H Urine: 121 mg/d — ABNORMAL HIGH (ref 50–100)

## 2014-01-13 LAB — CREATININE CLEARANCE, URINE, 24 HOUR
CREAT CLEAR: 141 mL/min — AB (ref 75–115)
CREATININE: 0.43 mg/dL — AB (ref 0.50–1.10)
Creatinine, 24H Ur: 873 mg/d (ref 700–1800)
Creatinine, Urine: 79.4 mg/dL

## 2014-01-16 ENCOUNTER — Encounter: Payer: Medicaid Other | Admitting: Family Medicine

## 2014-01-20 ENCOUNTER — Other Ambulatory Visit: Payer: Self-pay | Admitting: Obstetrics & Gynecology

## 2014-01-20 ENCOUNTER — Other Ambulatory Visit (HOSPITAL_COMMUNITY): Payer: Self-pay | Admitting: Obstetrics and Gynecology

## 2014-01-20 ENCOUNTER — Ambulatory Visit (INDEPENDENT_AMBULATORY_CARE_PROVIDER_SITE_OTHER): Payer: Medicaid Other | Admitting: Obstetrics & Gynecology

## 2014-01-20 ENCOUNTER — Ambulatory Visit (HOSPITAL_COMMUNITY): Payer: Medicaid Other

## 2014-01-20 ENCOUNTER — Encounter: Payer: Self-pay | Admitting: Obstetrics & Gynecology

## 2014-01-20 ENCOUNTER — Encounter (HOSPITAL_COMMUNITY): Payer: Self-pay

## 2014-01-20 ENCOUNTER — Ambulatory Visit (HOSPITAL_COMMUNITY)
Admission: RE | Admit: 2014-01-20 | Discharge: 2014-01-20 | Disposition: A | Discharge status: Home / Self Care | Payer: Medicaid Other | Source: Ambulatory Visit | Attending: Obstetrics & Gynecology | Admitting: Obstetrics & Gynecology

## 2014-01-20 VITALS — BP 127/86 | HR 86 | Wt 187.0 lb

## 2014-01-20 VITALS — BP 128/100 | HR 92 | Wt 186.0 lb

## 2014-01-20 DIAGNOSIS — O10019 Pre-existing essential hypertension complicating pregnancy, unspecified trimester: Secondary | ICD-10-CM

## 2014-01-20 DIAGNOSIS — Z8751 Personal history of pre-term labor: Secondary | ICD-10-CM

## 2014-01-20 DIAGNOSIS — O09299 Supervision of pregnancy with other poor reproductive or obstetric history, unspecified trimester: Secondary | ICD-10-CM

## 2014-01-20 DIAGNOSIS — IMO0002 Reserved for concepts with insufficient information to code with codable children: Secondary | ICD-10-CM

## 2014-01-20 DIAGNOSIS — O365990 Maternal care for other known or suspected poor fetal growth, unspecified trimester, not applicable or unspecified: Secondary | ICD-10-CM

## 2014-01-20 DIAGNOSIS — Z3480 Encounter for supervision of other normal pregnancy, unspecified trimester: Secondary | ICD-10-CM

## 2014-01-20 DIAGNOSIS — O133 Gestational [pregnancy-induced] hypertension without significant proteinuria, third trimester: Secondary | ICD-10-CM

## 2014-01-20 DIAGNOSIS — O10919 Unspecified pre-existing hypertension complicating pregnancy, unspecified trimester: Secondary | ICD-10-CM

## 2014-01-20 DIAGNOSIS — Z302 Encounter for sterilization: Secondary | ICD-10-CM

## 2014-01-20 DIAGNOSIS — Z3689 Encounter for other specified antenatal screening: Secondary | ICD-10-CM | POA: Diagnosis not present

## 2014-01-20 DIAGNOSIS — Z3685 Encounter for antenatal screening for Streptococcus B: Secondary | ICD-10-CM

## 2014-01-20 DIAGNOSIS — Z113 Encounter for screening for infections with a predominantly sexual mode of transmission: Secondary | ICD-10-CM

## 2014-01-20 DIAGNOSIS — O289 Unspecified abnormal findings on antenatal screening of mother: Secondary | ICD-10-CM

## 2014-01-20 NOTE — Progress Notes (Signed)
Routine visit. Good FM. BPP good today at [facility] per the patient. Cervical cultures obtained today. Labor precautions reviewed.

## 2014-01-21 LAB — GC/CHLAMYDIA PROBE AMP
CT Probe RNA: NEGATIVE
GC Probe RNA: NEGATIVE

## 2014-01-22 ENCOUNTER — Inpatient Hospital Stay (HOSPITAL_COMMUNITY)
Admission: AD | Admit: 2014-01-22 | Discharge: 2014-01-22 | Disposition: A | Discharge status: Home / Self Care | Payer: Medicaid Other | Source: Ambulatory Visit | Attending: Obstetrics & Gynecology | Admitting: Obstetrics & Gynecology

## 2014-01-22 ENCOUNTER — Encounter (HOSPITAL_COMMUNITY): Payer: Self-pay | Admitting: *Deleted

## 2014-01-22 DIAGNOSIS — O09299 Supervision of pregnancy with other poor reproductive or obstetric history, unspecified trimester: Secondary | ICD-10-CM

## 2014-01-22 DIAGNOSIS — O368131 Decreased fetal movements, third trimester, fetus 1: Secondary | ICD-10-CM

## 2014-01-22 DIAGNOSIS — O133 Gestational [pregnancy-induced] hypertension without significant proteinuria, third trimester: Secondary | ICD-10-CM

## 2014-01-22 DIAGNOSIS — Z302 Encounter for sterilization: Secondary | ICD-10-CM

## 2014-01-22 DIAGNOSIS — Z833 Family history of diabetes mellitus: Secondary | ICD-10-CM | POA: Insufficient documentation

## 2014-01-22 DIAGNOSIS — O368190 Decreased fetal movements, unspecified trimester, not applicable or unspecified: Secondary | ICD-10-CM | POA: Insufficient documentation

## 2014-01-22 DIAGNOSIS — O139 Gestational [pregnancy-induced] hypertension without significant proteinuria, unspecified trimester: Secondary | ICD-10-CM

## 2014-01-22 NOTE — Discharge Instructions (Signed)
Fetal Movement Counts Patient Name: __________________________________________________ Patient Due Date: ____________________ Performing a fetal movement count is highly recommended in high-risk pregnancies, but it is good for every pregnant woman to do. Your caregiver may ask you to start counting fetal movements at 28 weeks of the pregnancy. Fetal movements often increase:  After eating a full meal.  After physical activity.  After eating or drinking something sweet or cold.  At rest. Pay attention to when you feel the baby is most active. This will help you notice a pattern of your baby's sleep and wake cycles and what factors contribute to an increase in fetal movement. It is important to perform a fetal movement count at the same time each day when your baby is normally most active.  HOW TO COUNT FETAL MOVEMENTS 1. Find a quiet and comfortable area to sit or lie down on your left side. Lying on your left side provides the best blood and oxygen circulation to your baby. 2. Write down the day and time on a sheet of paper or in a journal. 3. Start counting kicks, flutters, swishes, rolls, or jabs in a 2 hour period. You should feel at least 10 movements within 2 hours. 4. If you do not feel 10 movements in 2 hours, wait 2 3 hours and count again. Look for a change in the pattern or not enough counts in 2 hours. SEEK MEDICAL CARE IF:  You feel less than 10 counts in 2 hours, tried twice.  There is no movement in over an hour.  The pattern is changing or taking longer each day to reach 10 counts in 2 hours.  You feel the baby is not moving as he or she usually does. Date: ____________ Movements: ____________ Start time: ____________ Finish time: ____________  Date: ____________ Movements: ____________ Start time: ____________ Finish time: ____________ Date: ____________ Movements: ____________ Start time: ____________ Finish time: ____________ Date: ____________ Movements: ____________  Start time: ____________ Finish time: ____________ Date: ____________ Movements: ____________ Start time: ____________ Finish time: ____________ Date: ____________ Movements: ____________ Start time: ____________ Finish time: ____________ Date: ____________ Movements: ____________ Start time: ____________ Finish time: ____________ Date: ____________ Movements: ____________ Start time: ____________ Finish time: ____________  Date: ____________ Movements: ____________ Start time: ____________ Finish time: ____________ Date: ____________ Movements: ____________ Start time: ____________ Finish time: ____________ Date: ____________ Movements: ____________ Start time: ____________ Finish time: ____________ Date: ____________ Movements: ____________ Start time: ____________ Finish time: ____________ Date: ____________ Movements: ____________ Start time: ____________ Finish time: ____________ Date: ____________ Movements: ____________ Start time: ____________ Finish time: ____________ Date: ____________ Movements: ____________ Start time: ____________ Finish time: ____________  Date: ____________ Movements: ____________ Start time: ____________ Finish time: ____________ Date: ____________ Movements: ____________ Start time: ____________ Finish time: ____________ Date: ____________ Movements: ____________ Start time: ____________ Finish time: ____________ Date: ____________ Movements: ____________ Start time: ____________ Finish time: ____________ Date: ____________ Movements: ____________ Start time: ____________ Finish time: ____________ Date: ____________ Movements: ____________ Start time: ____________ Finish time: ____________ Date: ____________ Movements: ____________ Start time: ____________ Finish time: ____________  Date: ____________ Movements: ____________ Start time: ____________ Finish time: ____________ Date: ____________ Movements: ____________ Start time: ____________ Finish time:  ____________ Date: ____________ Movements: ____________ Start time: ____________ Finish time: ____________ Date: ____________ Movements: ____________ Start time: ____________ Finish time: ____________ Date: ____________ Movements: ____________ Start time: ____________ Finish time: ____________ Date: ____________ Movements: ____________ Start time: ____________ Finish time: ____________ Date: ____________ Movements: ____________ Start time: ____________ Finish time: ____________  Date: ____________ Movements: ____________ Start time: ____________ Finish   time: ____________ Date: ____________ Movements: ____________ Start time: ____________ Finish time: ____________ Date: ____________ Movements: ____________ Start time: ____________ Finish time: ____________ Date: ____________ Movements: ____________ Start time: ____________ Finish time: ____________ Date: ____________ Movements: ____________ Start time: ____________ Finish time: ____________ Date: ____________ Movements: ____________ Start time: ____________ Finish time: ____________ Date: ____________ Movements: ____________ Start time: ____________ Finish time: ____________  Date: ____________ Movements: ____________ Start time: ____________ Finish time: ____________ Date: ____________ Movements: ____________ Start time: ____________ Finish time: ____________ Date: ____________ Movements: ____________ Start time: ____________ Finish time: ____________ Date: ____________ Movements: ____________ Start time: ____________ Finish time: ____________ Date: ____________ Movements: ____________ Start time: ____________ Finish time: ____________ Date: ____________ Movements: ____________ Start time: ____________ Finish time: ____________ Date: ____________ Movements: ____________ Start time: ____________ Finish time: ____________  Date: ____________ Movements: ____________ Start time: ____________ Finish time: ____________ Date: ____________ Movements:  ____________ Start time: ____________ Finish time: ____________ Date: ____________ Movements: ____________ Start time: ____________ Finish time: ____________ Date: ____________ Movements: ____________ Start time: ____________ Finish time: ____________ Date: ____________ Movements: ____________ Start time: ____________ Finish time: ____________ Date: ____________ Movements: ____________ Start time: ____________ Finish time: ____________ Date: ____________ Movements: ____________ Start time: ____________ Finish time: ____________  Date: ____________ Movements: ____________ Start time: ____________ Finish time: ____________ Date: ____________ Movements: ____________ Start time: ____________ Finish time: ____________ Date: ____________ Movements: ____________ Start time: ____________ Finish time: ____________ Date: ____________ Movements: ____________ Start time: ____________ Finish time: ____________ Date: ____________ Movements: ____________ Start time: ____________ Finish time: ____________ Date: ____________ Movements: ____________ Start time: ____________ Finish time: ____________ Document Released: 08/30/2006 Document Revised: 07/17/2012 Document Reviewed: 05/27/2012 [employer] Patient Information 2014 [employer].  

## 2014-01-22 NOTE — MAU Note (Addendum)
PT  SAYS LAST TIME  SHE FELT BABY MOVE WAS AT    1330 SHEATE AT [facility]   [id number]  S [facility]- AT [facility]-     BUT QTUESDAY  GOES TO [facility]-  FOR NST AND U/S.-   HIGH RISK.     VE - 2 CM  LAST Tuesday.    DENIES HSV AND MRSA.        SAYS HAS H/A- STARTED AT 0430-  TOOK  MED.-  NO RELIEF      NEXT APPOINTMENT- TOMORROW.   LAST SEX-  TUESDAY

## 2014-01-22 NOTE — MAU Provider Note (Signed)
First Provider Initiated Contact with Patient 01/22/14 2137      Chief Complaint:  Decreased fetal movement  Alicia Friedman is  23 y.o. [id number] at [redacted]w[redacted]d presents complaining of decreased fetal movement since 1330 today.  Baby started moving immediately upon placing EFM   Obstetrical/Gynecological History: OB History   Grav Para Term Preterm Abortions TAB SAB Ect Mult Living   6 2 0 2 3 0 3 0 0 2      Past Medical History: Past Medical History  Diagnosis Date  . Pre-eclampsia, delivered     Past Surgical History: Past Surgical History  Procedure Laterality Date  . 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 Tonsillectomy and adenoidectomy      Family History: Family History  Problem Relation Age of Onset  . Diabetes Mother   . Hypertension Mother   . Lupus Mother   . Thyroid disease Mother   . Cancer Maternal Grandmother     ovarian    Social History: History  Substance Use Topics  . Smoking status: Never Smoker   . Smokeless tobacco: Never Used  . Alcohol Use: No    Allergies:  Allergies  Allergen Reactions  . Shellfish Allergy Swelling    Meds:  Prescriptions prior to admission  Medication Sig Dispense Refill  . aspirin EC 81 MG tablet Take 1 tablet (81 mg total) by mouth daily.  30 tablet  10  . butalbital-acetaminophen-caffeine (FIORICET, ESGIC) 50-325-40 MG per tablet Take 1 tablet by mouth every 6 (six) hours as needed for headache.      . cyclobenzaprine (FLEXERIL) 10 MG tablet Take 1 tablet (10 mg total) by mouth 3 (three) times daily as needed for muscle spasms.  30 tablet  2  . Prenatal Vit-Fe Fumarate-FA (PRENATAL MULTIVITAMIN) TABS tablet Take 1 tablet by mouth daily at 12 noon.      . Blood Pressure Monitoring DEVI 1 Units by Does not apply route as needed (at least daily).  1 Device  0    Review of Systems   Constitutional: Negative for fever and chills Eyes: Negative for visual disturbances Respiratory: Negative for  shortness of breath, dyspnea Cardiovascular: Negative for chest pain or palpitations  Gastrointestinal: Negative for vomiting, diarrhea and constipation Genitourinary: Negative for dysuria and urgency Musculoskeletal: Negative for back pain, joint pain, myalgias  Neurological: Negative for dizziness and headaches    Physical Exam  Blood pressure 120/69, pulse 99, temperature 98.5 F (36.9 C), temperature source Oral, resp. rate 20, height 0' (0 m), last menstrual period 05/20/2013. GENERAL: Well-developed, well-nourished female in no acute distress.  ABDOMEN: Soft, nontender, nondistended, gravid.  EXTREMITIES: Nontender, no edema, 2+ distal pulses. DTR's 2+ FHT:  Baseline rate 140 bpm   Variability moderate  Accelerations present   Decelerations none Contractions: Every 0 mins     Assessment: Alicia Friedman is  23 y.o. [phone] at [redacted]w[redacted]d presents with decreased fetal movement, resolved.  Reactive NST.  Plan: F/U with twice weekly testing for [facility] as scheduled  CRESENZO-DISHMAN,Aizlyn Schifano 6/11/20159:44 PM

## 2014-01-23 ENCOUNTER — Encounter: Payer: Medicaid Other | Admitting: Obstetrics and Gynecology

## 2014-01-24 LAB — CULTURE, BETA STREP (GROUP B ONLY)

## 2014-01-25 ENCOUNTER — Inpatient Hospital Stay: Payer: Self-pay | Admitting: Obstetrics and Gynecology

## 2014-01-25 DIAGNOSIS — Z98891 History of uterine scar from previous surgery: Secondary | ICD-10-CM

## 2014-01-25 HISTORY — DX: History of uterine scar from previous surgery: Z98.891

## 2014-01-25 LAB — CBC
HCT: 16.7 % — AB (ref 35.0–47.0)
HGB: 5.7 g/dL — AB (ref 12.0–16.0)
MCH: 30.6 pg (ref 26.0–34.0)
MCHC: 33.9 g/dL (ref 32.0–36.0)
MCV: 90 fL (ref 80–100)
Platelet: 115 10*3/uL — ABNORMAL LOW (ref 150–440)
RBC: 1.85 10*6/uL — ABNORMAL LOW (ref 3.80–5.20)
RDW: 12.7 % (ref 11.5–14.5)
WBC: 21.4 10*3/uL — ABNORMAL HIGH (ref 3.6–11.0)

## 2014-01-25 LAB — COMPREHENSIVE METABOLIC PANEL
ALBUMIN: 1.9 g/dL — AB (ref 3.4–5.0)
AST: 20 U/L (ref 15–37)
Alkaline Phosphatase: 88 U/L
Anion Gap: 6 — ABNORMAL LOW (ref 7–16)
BILIRUBIN TOTAL: 0.7 mg/dL (ref 0.2–1.0)
BUN: 6 mg/dL — ABNORMAL LOW (ref 7–18)
CALCIUM: 7.6 mg/dL — AB (ref 8.5–10.1)
CHLORIDE: 112 mmol/L — AB (ref 98–107)
CREATININE: 0.94 mg/dL (ref 0.60–1.30)
Co2: 22 mmol/L (ref 21–32)
EGFR (African American): >60
EGFR (Non-African Amer.): >60
GLUCOSE: 115 mg/dL — AB (ref 65–99)
OSMOLALITY: 278 (ref 275–301)
POTASSIUM: 3.7 mmol/L (ref 3.5–5.1)
SGPT (ALT): 10 U/L — ABNORMAL LOW (ref 12–78)
Sodium: 140 mmol/L (ref 136–145)
Total Protein: 4.5 g/dL — ABNORMAL LOW (ref 6.4–8.2)

## 2014-01-25 LAB — CBC WITH DIFFERENTIAL/PLATELET
Basophil #: 0.1 10*3/uL (ref 0.0–0.1)
Basophil %: 0.3 %
EOS ABS: 0 10*3/uL (ref 0.0–0.7)
Eosinophil %: 0.2 %
HCT: 22.8 % — ABNORMAL LOW (ref 35.0–47.0)
HGB: 7.8 g/dL — AB (ref 12.0–16.0)
LYMPHS PCT: 7.2 %
Lymphocyte #: 1.8 10*3/uL (ref 1.0–3.6)
MCH: 31.2 pg (ref 26.0–34.0)
MCHC: 34.3 g/dL (ref 32.0–36.0)
MCV: 91 fL (ref 80–100)
MONOS PCT: 7.1 %
Monocyte #: 1.8 x10 3/mm — ABNORMAL HIGH (ref 0.2–0.9)
Neutrophil #: 21.1 10*3/uL — ABNORMAL HIGH (ref 1.4–6.5)
Neutrophil %: 85.2 %
PLATELETS: 175 10*3/uL (ref 150–440)
RBC: 2.51 10*6/uL — ABNORMAL LOW (ref 3.80–5.20)
RDW: 13 % (ref 11.5–14.5)
WBC: 24.8 10*3/uL — ABNORMAL HIGH (ref 3.6–11.0)

## 2014-01-25 LAB — DRUG SCREEN, URINE
AMPHETAMINES, UR SCREEN: NEGATIVE (ref ?–1000)
BARBITURATES, UR SCREEN: NEGATIVE (ref ?–200)
BENZODIAZEPINE, UR SCRN: POSITIVE (ref ?–200)
Cannabinoid 50 Ng, Ur ~~LOC~~: POSITIVE (ref ?–50)
Cocaine Metabolite,Ur ~~LOC~~: NEGATIVE (ref ?–300)
MDMA (ECSTASY) UR SCREEN: NEGATIVE (ref ?–500)
Methadone, Ur Screen: NEGATIVE (ref ?–300)
OPIATE, UR SCREEN: NEGATIVE (ref ?–300)
PHENCYCLIDINE (PCP) UR S: NEGATIVE (ref ?–25)
Tricyclic, Ur Screen: NEGATIVE (ref ?–1000)

## 2014-01-25 LAB — FIBRINOGEN
FIBRINOGEN: 143 mg/dL — AB (ref 210–470)
Fibrinogen: 96 mg/dL — CL (ref 210–470)

## 2014-01-25 LAB — PROTIME-INR
INR: 1.5
INR: 1.3
Prothrombin Time: 18.1 secs — ABNORMAL HIGH (ref 11.5–14.7)
Prothrombin Time: 16.2 secs — ABNORMAL HIGH (ref 11.5–14.7)

## 2014-01-25 LAB — APTT
ACTIVATED PTT: 30.1 s (ref 23.6–35.9)
Activated PTT: 38.5 secs — ABNORMAL HIGH (ref 23.6–35.9)

## 2014-01-26 LAB — CBC WITH DIFFERENTIAL/PLATELET
BASOS ABS: 0 10*3/uL (ref 0.0–0.1)
Basophil #: 0 10*3/uL (ref 0.0–0.1)
Basophil %: 0.2 %
Basophil %: 0.2 %
EOS ABS: 0 10*3/uL (ref 0.0–0.7)
EOS PCT: 0.1 %
Eosinophil #: 0 10*3/uL (ref 0.0–0.7)
Eosinophil %: 0.2 %
HCT: 19.7 % — ABNORMAL LOW (ref 35.0–47.0)
HCT: 21.7 % — ABNORMAL LOW (ref 35.0–47.0)
HGB: 6.7 g/dL — ABNORMAL LOW (ref 12.0–16.0)
HGB: 7.4 g/dL — ABNORMAL LOW (ref 12.0–16.0)
LYMPHS ABS: 1.5 10*3/uL (ref 1.0–3.6)
LYMPHS PCT: 9.1 %
Lymphocyte #: 1.3 10*3/uL (ref 1.0–3.6)
Lymphocyte %: 9.9 %
MCH: 30.4 pg (ref 26.0–34.0)
MCH: 29.6 pg (ref 26.0–34.0)
MCHC: 34.2 g/dL (ref 32.0–36.0)
MCHC: 34 g/dL (ref 32.0–36.0)
MCV: 89 fL (ref 80–100)
MCV: 87 fL (ref 80–100)
MONO ABS: 0.8 x10 3/mm (ref 0.2–0.9)
Monocyte #: 1 x10 3/mm — ABNORMAL HIGH (ref 0.2–0.9)
Monocyte %: 5.6 %
Monocyte %: 6.6 %
NEUTROS ABS: 12.4 10*3/uL — AB (ref 1.4–6.5)
NEUTROS PCT: 83.1 %
Neutrophil #: 12.5 10*3/uL — ABNORMAL HIGH (ref 1.4–6.5)
Neutrophil %: 85 %
Platelet: 86 10*3/uL — ABNORMAL LOW (ref 150–440)
Platelet: 100 10*3/uL — ABNORMAL LOW (ref 150–440)
RBC: 2.22 10*6/uL — AB (ref 3.80–5.20)
RBC: 2.49 10*6/uL — AB (ref 3.80–5.20)
RDW: 14.1 % (ref 11.5–14.5)
RDW: 15.2 % — ABNORMAL HIGH (ref 11.5–14.5)
WBC: 14.7 10*3/uL — ABNORMAL HIGH (ref 3.6–11.0)
WBC: 14.9 10*3/uL — ABNORMAL HIGH (ref 3.6–11.0)

## 2014-01-26 LAB — PROTIME-INR
INR: 1.1
Prothrombin Time: 14.5 secs (ref 11.5–14.7)

## 2014-01-26 LAB — BASIC METABOLIC PANEL
Anion Gap: 7 (ref 7–16)
BUN: 11 mg/dL (ref 7–18)
CHLORIDE: 112 mmol/L — AB (ref 98–107)
CREATININE: 1.31 mg/dL — AB (ref 0.60–1.30)
Calcium, Total: 7.5 mg/dL — ABNORMAL LOW (ref 8.5–10.1)
Co2: 22 mmol/L (ref 21–32)
EGFR (African American): >60
GFR CALC NON AF AMER: 57 — AB
Glucose: 97 mg/dL (ref 65–99)
Osmolality: 281 (ref 275–301)
Potassium: 4 mmol/L (ref 3.5–5.1)
SODIUM: 141 mmol/L (ref 136–145)

## 2014-01-26 LAB — CREATININE, SERUM
Creatinine: 1.22 mg/dL (ref 0.60–1.30)
EGFR (African American): >60
EGFR (Non-African Amer.): >60

## 2014-01-26 LAB — HEPATIC FUNCTION PANEL A (ARMC)
ALBUMIN: 1.9 g/dL — AB (ref 3.4–5.0)
Alkaline Phosphatase: 65 U/L
BILIRUBIN DIRECT: 0.2 mg/dL (ref 0.00–0.20)
Bilirubin,Total: 1 mg/dL (ref 0.2–1.0)
SGOT(AST): 30 U/L (ref 15–37)
SGPT (ALT): 13 U/L (ref 12–78)
TOTAL PROTEIN: 4.4 g/dL — AB (ref 6.4–8.2)

## 2014-01-26 LAB — APTT: ACTIVATED PTT: 28.9 s (ref 23.6–35.9)

## 2014-01-26 LAB — FIBRINOGEN: Fibrinogen: 210 mg/dL (ref 210–470)

## 2014-01-27 ENCOUNTER — Telehealth: Payer: Self-pay

## 2014-01-27 ENCOUNTER — Other Ambulatory Visit (HOSPITAL_COMMUNITY): Payer: Medicaid Other

## 2014-01-27 ENCOUNTER — Other Ambulatory Visit: Payer: Medicaid Other

## 2014-01-27 ENCOUNTER — Ambulatory Visit (HOSPITAL_COMMUNITY): Payer: Medicaid Other

## 2014-01-27 LAB — CBC WITH DIFFERENTIAL/PLATELET
BASOS ABS: 0 10*3/uL (ref 0.0–0.1)
Basophil %: 0.2 %
EOS ABS: 0.1 10*3/uL (ref 0.0–0.7)
EOS PCT: 0.4 %
HCT: 18 % — ABNORMAL LOW (ref 35.0–47.0)
HGB: 6.2 g/dL — ABNORMAL LOW (ref 12.0–16.0)
LYMPHS ABS: 1.9 10*3/uL (ref 1.0–3.6)
Lymphocyte %: 13.1 %
MCH: 30 pg (ref 26.0–34.0)
MCHC: 34.5 g/dL (ref 32.0–36.0)
MCV: 87 fL (ref 80–100)
Monocyte #: 0.8 x10 3/mm (ref 0.2–0.9)
Monocyte %: 5.8 %
NEUTROS ABS: 11.4 10*3/uL — AB (ref 1.4–6.5)
Neutrophil %: 80.5 %
Platelet: 101 10*3/uL — ABNORMAL LOW (ref 150–440)
RBC: 2.07 10*6/uL — ABNORMAL LOW (ref 3.80–5.20)
RDW: 15.3 % — ABNORMAL HIGH (ref 11.5–14.5)
WBC: 14.1 10*3/uL — ABNORMAL HIGH (ref 3.6–11.0)

## 2014-01-27 LAB — BASIC METABOLIC PANEL
Anion Gap: 6 — ABNORMAL LOW (ref 7–16)
BUN: 6 mg/dL — AB (ref 7–18)
CHLORIDE: 109 mmol/L — AB (ref 98–107)
CO2: 25 mmol/L (ref 21–32)
Calcium, Total: 7.8 mg/dL — ABNORMAL LOW (ref 8.5–10.1)
Creatinine: 0.97 mg/dL (ref 0.60–1.30)
EGFR (African American): >60
EGFR (Non-African Amer.): >60
GLUCOSE: 72 mg/dL (ref 65–99)
OSMOLALITY: 276 (ref 275–301)
Potassium: 3.3 mmol/L — ABNORMAL LOW (ref 3.5–5.1)
Sodium: 140 mmol/L (ref 136–145)

## 2014-01-27 LAB — PATHOLOGY REPORT

## 2014-01-27 NOTE — Telephone Encounter (Signed)
Called Alicia Friedman on 01/23/14 aboout her missed appointment and did not get a reply. I tried to leave her a message but it did not allow me to. I also contacted her on 01/26/13 to remind her of her next appointment and to check up on her. No answer. Tina scheduled her back for appointment on 01/27/14.

## 2014-01-28 LAB — CBC WITH DIFFERENTIAL/PLATELET
BASOS PCT: 0.2 %
Basophil #: 0 10*3/uL (ref 0.0–0.1)
EOS ABS: 0.1 10*3/uL (ref 0.0–0.7)
EOS PCT: 1.2 %
HCT: 20 % — AB (ref 35.0–47.0)
HGB: 6.9 g/dL — AB (ref 12.0–16.0)
Lymphocyte #: 1.8 10*3/uL (ref 1.0–3.6)
Lymphocyte %: 15.3 %
MCH: 30.2 pg (ref 26.0–34.0)
MCHC: 34.7 g/dL (ref 32.0–36.0)
MCV: 87 fL (ref 80–100)
MONO ABS: 0.7 x10 3/mm (ref 0.2–0.9)
Monocyte %: 5.7 %
NEUTROS ABS: 9.1 10*3/uL — AB (ref 1.4–6.5)
Neutrophil %: 77.6 %
Platelet: 111 10*3/uL — ABNORMAL LOW (ref 150–440)
RBC: 2.3 10*6/uL — ABNORMAL LOW (ref 3.80–5.20)
RDW: 15.3 % — ABNORMAL HIGH (ref 11.5–14.5)
WBC: 11.7 10*3/uL — AB (ref 3.6–11.0)

## 2014-01-29 ENCOUNTER — Encounter: Payer: Self-pay | Admitting: Obstetrics & Gynecology

## 2014-01-29 DIAGNOSIS — O99820 Streptococcus B carrier state complicating pregnancy: Secondary | ICD-10-CM | POA: Insufficient documentation

## 2014-01-29 LAB — CBC WITH DIFFERENTIAL/PLATELET
Basophil #: 0 10*3/uL (ref 0.0–0.1)
Basophil %: 0.3 %
EOS PCT: 1.8 %
Eosinophil #: 0.1 10*3/uL (ref 0.0–0.7)
HCT: 25.4 % — ABNORMAL LOW (ref 35.0–47.0)
HGB: 8.9 g/dL — AB (ref 12.0–16.0)
Lymphocyte #: 1.4 10*3/uL (ref 1.0–3.6)
Lymphocyte %: 17.3 %
MCH: 30.8 pg (ref 26.0–34.0)
MCHC: 35 g/dL (ref 32.0–36.0)
MCV: 88 fL (ref 80–100)
MONO ABS: 0.4 x10 3/mm (ref 0.2–0.9)
Monocyte %: 5.5 %
NEUTROS ABS: 6.1 10*3/uL (ref 1.4–6.5)
Neutrophil %: 75.1 %
PLATELETS: 158 10*3/uL (ref 150–440)
RBC: 2.88 10*6/uL — ABNORMAL LOW (ref 3.80–5.20)
RDW: 14.8 % — AB (ref 11.5–14.5)
WBC: 8.1 10*3/uL (ref 3.6–11.0)

## 2014-01-30 ENCOUNTER — Inpatient Hospital Stay (HOSPITAL_COMMUNITY)
Admission: AD | Admit: 2014-01-30 | Discharge: 2014-01-30 | Discharge status: Against Medical Advice | Payer: Medicaid Other | Source: Ambulatory Visit | Attending: Obstetrics & Gynecology | Admitting: Obstetrics & Gynecology

## 2014-01-30 ENCOUNTER — Encounter: Payer: Self-pay | Admitting: Obstetrics & Gynecology

## 2014-01-30 ENCOUNTER — Encounter (HOSPITAL_COMMUNITY): Payer: Self-pay | Admitting: *Deleted

## 2014-01-30 ENCOUNTER — Ambulatory Visit (INDEPENDENT_AMBULATORY_CARE_PROVIDER_SITE_OTHER): Payer: Medicaid Other | Admitting: Obstetrics & Gynecology

## 2014-01-30 VITALS — BP 162/122 | HR 113

## 2014-01-30 DIAGNOSIS — O99893 Other specified diseases and conditions complicating puerperium: Secondary | ICD-10-CM | POA: Insufficient documentation

## 2014-01-30 DIAGNOSIS — Z5320 Procedure and treatment not carried out because of patient's decision for unspecified reasons: Secondary | ICD-10-CM | POA: Insufficient documentation

## 2014-01-30 DIAGNOSIS — R030 Elevated blood-pressure reading, without diagnosis of hypertension: Secondary | ICD-10-CM | POA: Insufficient documentation

## 2014-01-30 DIAGNOSIS — O9989 Other specified diseases and conditions complicating pregnancy, childbirth and the puerperium: Principal | ICD-10-CM

## 2014-01-30 DIAGNOSIS — O1415 Severe pre-eclampsia, complicating the puerperium: Secondary | ICD-10-CM

## 2014-01-30 DIAGNOSIS — Z4802 Encounter for removal of sutures: Secondary | ICD-10-CM

## 2014-01-30 NOTE — MAU Note (Signed)
Pt only wanted her B/P checked and did not want to go back into MAU room for further eval. Zorita PangH Hogan CNM made aware and wants to further eval pt. Pt refused and signed AMA form after benefits and risks discussed. States will return in AM for further eval when she comes back to see her daughter in NICU

## 2014-01-30 NOTE — Progress Notes (Signed)
Patient is here today to follow up from the hospital and to have her staples removed.  Incision is healing well and is clean with minimal discharge.  Staple removal is tolerated well and steri strips are applied for additional support for the incision.  Patient is instructed to continue cleaning and keeping the area dry and follow up if there are any further questions or concerns regarding her incision.

## 2014-01-30 NOTE — Patient Instructions (Signed)
Return to clinic for any obstetric concerns or go to [facility] for evaluation  

## 2014-01-30 NOTE — MAU Note (Addendum)
Delivered emerg CS 6/14 due to placental abruption. Received blood and plasma. Was seen [facility] this am and B/P 162/122. Told to come here for ck and couldn't come til now. Baby in NICU doing well. Does have bad h/a and incision sore. Has been 6 hours since taking pain med and feels if she goes home and takes med and rests she will feel better.

## 2014-01-30 NOTE — Progress Notes (Signed)
Review[facility]. Patient presented with heavy vaginal bleeding at 5533w3d, BP 150/90FHR auscultated around 70 bpm, and she was tken for stat C-section under general anesthesia. Apgars 0/0/4, pH 6.92. Procedure complicated by DIC, needing re-exploration, 5 units of pRBCs, 2 units of FFP. Hemoglobin nadir was 5.7, discharge hemoglobin was 8.9.  She stabilized after her coagulation studies normalized.  She had mild range BPs consistent with GHTN in absence of proteinuria or other severe features.  There is no mention of whether she received magnesium sulfate.  Of note, admission drug screen was notable for THC and benzodiazepines, negative for cocaine.  She was discharged on POD#4.   These records will be scanned into her chart as soon as possible.   Jaynie CollinsUGONNA  Ruchel Brandenburger, MD, FACOG Attending Obstetrician & Gynecologist Center for [facility], [facility]

## 2014-01-30 NOTE — Progress Notes (Signed)
   CLINIC ENCOUNTER NOTE  23 y.o. [phone] here today for followup after having a placental abruption in the setting of severe preeclampsia at 7426w5d necessitating an [facility]; baby had to undergo intensive resuscitation and is in the NICU under critical condition.  Patient was sent here for incisional staple removal, which was done by Riley Nearingina Noguis, CMA prior to my encounter with the patient. On encounter with patient and her sister, patient was very angry and agitated, and saying that we "should have prevented this outcome" by treating her BP with medications and delivering her earlier.  On review of her chart, patient did not have any indications for early delivery and was in antenatal testing; also had weekly BPP/doppler studies at [facility] so she was also being followed closely by [facility].  There was no indication for early delivery or treatment with antihypertensive medications.  She reported having BP of 140s/100s when she checked at home and at the Pharmacy near her home; and she said she took pictures of these readings and brought them to her appointments, but no antihypertensive medications were administered. Of note, her BP was 166/122 today. I told I was worried and informed her that she needed further evaluation but she said "you should have worried when I was pregnant. I don't want to hear about it now."  Patient demanded that I give her a Xanax prescription, but I declined this request as she kept saying that we failed to prescribe her antihypertensives and the Xanax she requests but willingly prescribed her Flexeril "which caused seizures for her baby".  I tried to give support to her and her sister but she left the room screaming and angry at me and the entire clinic staff, and was still raising her voice at me in the waiting room in front of other waiting patients. She asked for a copy of her medical records which were given to the patient prior to her departure from the clinic with  her sister.     Alicia CollinsUGONNA  Alicia Amendola, MD, FACOG Attending Obstetrician & Gynecologist Center for [facility], [facility]

## 2014-02-02 ENCOUNTER — Other Ambulatory Visit: Payer: Self-pay | Admitting: Obstetrics and Gynecology

## 2014-02-02 NOTE — MAU Provider Note (Signed)
Attestation of Attending Supervision of Advanced Practitioner (CNM/NP): Evaluation and management procedures were performed by the Advanced Practitioner under my supervision and collaboration. I have reviewed the Advanced Practitioner's note and chart, and I agree with the management and plan.  LEGGETT,KELLY H. 5:07 PM

## 2014-02-07 ENCOUNTER — Emergency Department: Payer: Self-pay | Admitting: Emergency Medicine

## 2014-02-16 ENCOUNTER — Telehealth: Payer: Self-pay | Admitting: *Deleted

## 2014-02-16 MED ORDER — CLONAZEPAM 1 MG PO TABS: 1.0000 mg | ORAL_TABLET | Freq: Two times a day (BID) | ORAL | Status: DC | PRN

## 2014-02-16 NOTE — Addendum Note (Signed)
Addended by: Reva BoresPRATT, Salaam Battershell S on: 02/16/2014 04:56 PM   Modules accepted: Orders

## 2014-02-16 NOTE — Telephone Encounter (Signed)
Call her in some Klonopin--use with caution--can be addicting--will address further at her post op check.

## 2014-02-16 NOTE — Telephone Encounter (Signed)
Patient has called requesting something to take for anxiety due to her recent delivery situation.  She has scheduled a follow up for two weeks from now with Dr.Pratt.  She had to reschedule from Dr. Jolayne Pantheronstant due to her new work schedule.

## 2014-02-17 ENCOUNTER — Telehealth: Payer: Self-pay | Admitting: *Deleted

## 2014-02-17 ENCOUNTER — Ambulatory Visit: Payer: Medicaid Other | Admitting: Family Medicine

## 2014-02-17 NOTE — Telephone Encounter (Signed)
Called in medication per Dr. Shawnie PonsPratt

## 2014-02-23 ENCOUNTER — Ambulatory Visit: Payer: Medicaid Other | Admitting: Obstetrics and Gynecology

## 2014-02-27 ENCOUNTER — Ambulatory Visit (INDEPENDENT_AMBULATORY_CARE_PROVIDER_SITE_OTHER): Payer: Medicaid Other | Admitting: Family Medicine

## 2014-02-27 ENCOUNTER — Encounter: Payer: Self-pay | Admitting: Family Medicine

## 2014-02-27 VITALS — BP 144/101 | HR 88 | Ht 62.5 in | Wt 167.4 lb

## 2014-02-27 DIAGNOSIS — R1030 Lower abdominal pain, unspecified: Secondary | ICD-10-CM

## 2014-02-27 DIAGNOSIS — F32A Depression, unspecified: Secondary | ICD-10-CM

## 2014-02-27 DIAGNOSIS — R109 Unspecified abdominal pain: Secondary | ICD-10-CM

## 2014-02-27 DIAGNOSIS — F3289 Other specified depressive episodes: Secondary | ICD-10-CM

## 2014-02-27 DIAGNOSIS — F329 Major depressive disorder, single episode, unspecified: Secondary | ICD-10-CM | POA: Insufficient documentation

## 2014-02-27 MED ORDER — TRAMADOL HCL 50 MG PO TABS: 50.0000 mg | ORAL_TABLET | Freq: Four times a day (QID) | ORAL | Status: DC | PRN

## 2014-02-27 MED ORDER — CLONAZEPAM 1 MG PO TABS: 1.0000 mg | ORAL_TABLET | Freq: Two times a day (BID) | ORAL | Status: DC | PRN

## 2014-02-27 MED ORDER — CITALOPRAM HYDROBROMIDE 20 MG PO TABS: 20.0000 mg | ORAL_TABLET | Freq: Every day | ORAL | Status: DC

## 2014-02-27 NOTE — Patient Instructions (Addendum)
Laparoscopic Tubal Ligation Laparoscopic tubal ligation is a procedure that closes the fallopian tubes at a time other than right after childbirth. By closing the fallopian tubes, the eggs that are released from the ovaries cannot enter the uterus and sperm cannot reach the egg. Tubal ligation is also known as getting your "tubes tied." Tubal ligation is done so you will not be able to get pregnant or have a baby.  Although this procedure may be reversed, it should be considered permanent and irreversible. If you want to have future pregnancies, you should not have this procedure.  LET YOUR CAREGIVER KNOW ABOUT:  Allergies to food or medicine.  Medicines taken, including vitamins, herbs, eyedrops, over-the-counter medicines, and creams.  Use of steroids (by mouth or creams).  Previous problems with numbing medicines.  History of bleeding problems or blood clots.  Any recent colds or infections.  Previous surgery.  Other health problems, including diabetes and kidney problems.  Possibility of pregnancy, if this applies.  Any past pregnancies. RISKS AND COMPLICATIONS   Infection.  Bleeding.  Injury to surrounding organs.  Anesthetic side effects.  Failure of the procedure.  Ectopic pregnancy.  Future regret about having the procedure done. BEFORE THE PROCEDURE  Do not take aspirin or blood thinners a week before the procedure or as directed. This can cause bleeding.  Do not eat or drink anything 6 to 8 hours before the procedure. PROCEDURE   You may be given a medicine to help you relax (sedative) before the procedure. You will be given a medicine to make you sleep (general anesthetic) during the procedure.  A tube will be put down your throat to help your breath while under general anesthesia.  Two small cuts (incisions) are made in the lower abdominal area and near the belly button.  Your abdominal area will be inflated with a safe gas (carbon dioxide). This helps  give the surgeon room to operate, visualize, and helps the surgeon avoid other organs.  A thin, lighted tube (laparoscope) with a camera attached is inserted into your abdomen through one of the incisions near the belly button. Other small instruments are also inserted through the other abdominal incision.  The fallopian tubes are located and are either blocked with a ring, clip, or are burned (cauterized).  After the fallopian tubes are blocked, the gas is released from the abdomen.  The incisions will be closed with stitches (sutures), and a bandage may be placed over the incisions. AFTER THE PROCEDURE   You will rest in a recovery room for 1--4 hours until you are stable and doing well.  You will also have some mild abdominal discomfort for 3--7 days. You will be given pain medicine to ease any discomfort.  As long as there are no problems, you may be allowed to go home. Someone will need to drive you home and be with you for at least 24 hours once home.  You may have some mild discomfort in the throat. This is from the tube placed in your throat while you were sleeping.  You may experience discomfort in the shoulder area from some trapped air between the liver and diaphragm. This sensation is normal and will slowly go away on its own. Document Released: 11/06/2000 Document Revised: 01/30/2012 Document Reviewed: 11/11/2011 [facility] Patient Information 2015 [place], [place]. This information is not intended to replace advice given to you by your health care provider. Make sure you discuss any questions you have with your health care provider. Depression, Adult  Depression refers to feeling sad, low, down in the dumps, blue, gloomy, or empty. In general, there are two kinds of depression: 1. Depression that we all experience from time to time because of upsetting life experiences, including the loss of a job or the ending of a relationship (normal sadness or normal grief). This kind of  depression is considered normal, is short lived, and resolves within a few days to 2 weeks. (Depression experienced after the loss of a loved one is called bereavement. Bereavement often lasts longer than 2 weeks but normally gets better with time.) 2. Clinical depression, which lasts longer than normal sadness or normal grief or interferes with your ability to function at home, at work, and in school. It also interferes with your personal relationships. It affects almost every aspect of your life. Clinical depression is an illness. Symptoms of depression also can be caused by conditions other than normal sadness and grief or clinical depression. Examples of these conditions are listed as follows:  Physical illness--Some physical illnesses, including underactive thyroid gland (hypothyroidism), severe anemia, specific types of cancer, diabetes, uncontrolled seizures, heart and lung problems, strokes, and chronic pain are commonly associated with symptoms of depression.  Side effects of some prescription medicine--In some people, certain types of prescription medicine can cause symptoms of depression.  Substance abuse--Abuse of alcohol and illicit drugs can cause symptoms of depression. SYMPTOMS Symptoms of normal sadness and normal grief include the following:  Feeling sad or crying for short periods of time.  Not caring about anything (apathy).  Difficulty sleeping or sleeping too much.  No longer able to enjoy the things you used to enjoy.  Desire to be by oneself all the time (social isolation).  Lack of energy or motivation.  Difficulty concentrating or remembering.  Change in appetite or weight.  Restlessness or agitation. Symptoms of clinical depression include the same symptoms of normal sadness or normal grief and also the following symptoms:  Feeling sad or crying all the time.  Feelings of guilt or worthlessness.  Feelings of hopelessness or helplessness.  Thoughts of  suicide or the desire to harm yourself (suicidal ideation).  Loss of touch with reality (psychotic symptoms). Seeing or hearing things that are not real (hallucinations) or having false beliefs about your life or the people around you (delusions and paranoia). DIAGNOSIS  The diagnosis of clinical depression usually is based on the severity and duration of the symptoms. Your caregiver also will ask you questions about your medical history and substance use to find out if physical illness, use of prescription medicine, or substance abuse is causing your depression. Your caregiver also may order blood tests. TREATMENT  Typically, normal sadness and normal grief do not require treatment. However, sometimes antidepressant medicine is prescribed for bereavement to ease the depressive symptoms until they resolve. The treatment for clinical depression depends on the severity of your symptoms but typically includes antidepressant medicine, counseling with a mental health professional, or a combination of both. Your caregiver will help to determine what treatment is best for you. Depression caused by physical illness usually goes away with appropriate medical treatment of the illness. If prescription medicine is causing depression, talk with your caregiver about stopping the medicine, decreasing the dose, or substituting another medicine. Depression caused by abuse of alcohol or illicit drugs abuse goes away with abstinence from these substances. Some adults need professional help in order to stop drinking or using drugs. SEEK IMMEDIATE CARE IF:  You have thoughts about hurting yourself  or others.  You lose touch with reality (have psychotic symptoms).  You are taking medicine for depression and have a serious side effect. FOR MORE INFORMATION National Alliance on Mental Illness: www.nami.[employer] National Institute of Mental Health: http://www.maynard.net/www.nimh.nih.gov Document Released: 07/28/2000 Document Revised: 01/30/2012  Document Reviewed: 10/30/2011 [facility] Patient Information 2015 [place]. This information is not intended to replace advice given to you by your health care provider. Make sure you discuss any questions you have with your health care provider.

## 2014-02-27 NOTE — Progress Notes (Signed)
  Subjective:     Alicia Friedman is a 23 y.o. female who presents for a postpartum visit. She is 6 weeks postpartum following a low cervical transverse Cesarean section. I have fully reviewed the prenatal and intrapartum course. The delivery was at 35 gestational weeks. Outcome: primary cesarean section, low transverse incision. Anesthesia: general. Postpartum course has been normal. Baby's course has been eventful.  Baby remains in the NICU. Pt. Had emergent delivery with profound abruption. Pt. Required 6 u PRBC's and FFP and had to return to OR for bleeding. Baby is feeding by NGT. Bleeding no bleeding. Bowel function is normal. Bladder function is normal. Patient is sexually active. Contraception method is none. Postpartum depression screening: positive.  The following portions of the patient's history were reviewed and updated as appropriate: allergies, current medications, past family history, past medical history, past social history, past surgical history and problem list.  Review of Systems A comprehensive review of systems was negative.   Objective:    BP 144/101  Pulse 88  Ht 5' 2.5" (1.588 m)  Wt 167 lb 6 oz (75.921 kg)  BMI 30.11 kg/m2  Breastfeeding? No  General:  alert, cooperative and appears stated age  Abdomen: soft, non-tender; bowel sounds normal; no masses,  no organomegaly        Assessment:    Normal postpartum exam. Pap smear not done at today's visit.   Plan:    1. Contraception: tubal ligation scheduled for the end of the month 2. infant remains in NICU with severe encephalopathy. 3.  Refilled pain meds and clonopin. 4.  Trial of Celexa--to see therapist prn 5. Follow up in: 3 months or as needed.

## 2014-02-27 NOTE — Progress Notes (Signed)
Patient ID: Lars MassonLakia Friedman, female   DOB: [birth year], 23 y.o.   MRN: [id number] Here today for postpartum visit.  Rates her incisional pain at 7-8.  Currently taking 12 800mg  ibuprofen's a day for the pay.  Taking four a day of her anti anxiety medication instead of two as prescribed, but she does feel this regulates her better. [facility]

## 2014-03-02 ENCOUNTER — Telehealth: Payer: Self-pay | Admitting: *Deleted

## 2014-03-02 ENCOUNTER — Encounter (HOSPITAL_COMMUNITY): Payer: Self-pay | Admitting: Pharmacy Technician

## 2014-03-02 NOTE — Telephone Encounter (Signed)
Patient is requesting a refill of Klonipin.

## 2014-03-05 ENCOUNTER — Encounter (HOSPITAL_COMMUNITY): Payer: Self-pay | Admitting: *Deleted

## 2014-03-08 IMAGING — US US OB COMP +14 WK
1 of 2 series · 12 of 28 positions shown · non-contrast
Comparison: none

[Series 1: us ob detail +14 wk · 12 of 72 slices shown]
[im 3/72]
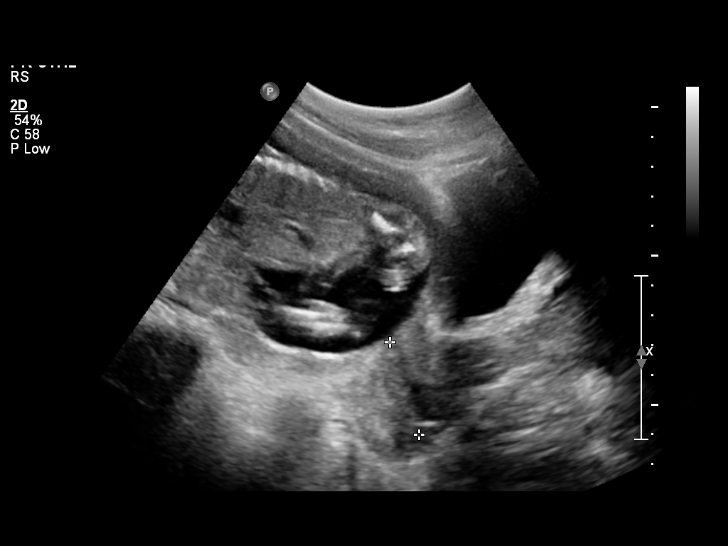
[im 9/72]
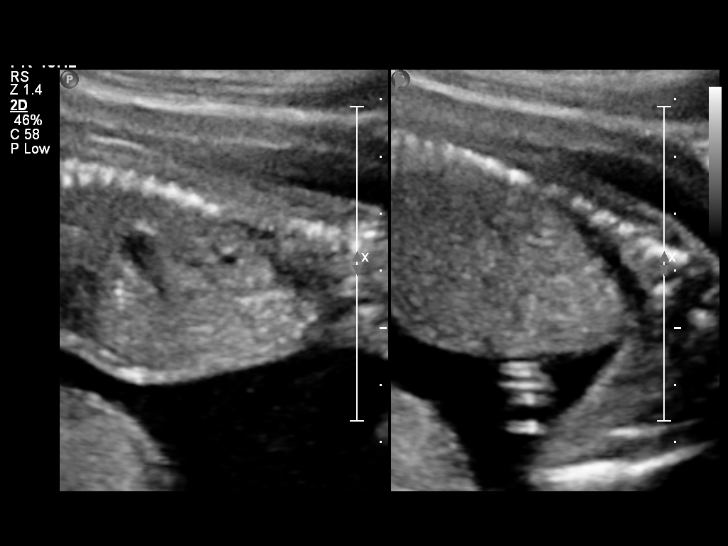
[im 14/72]
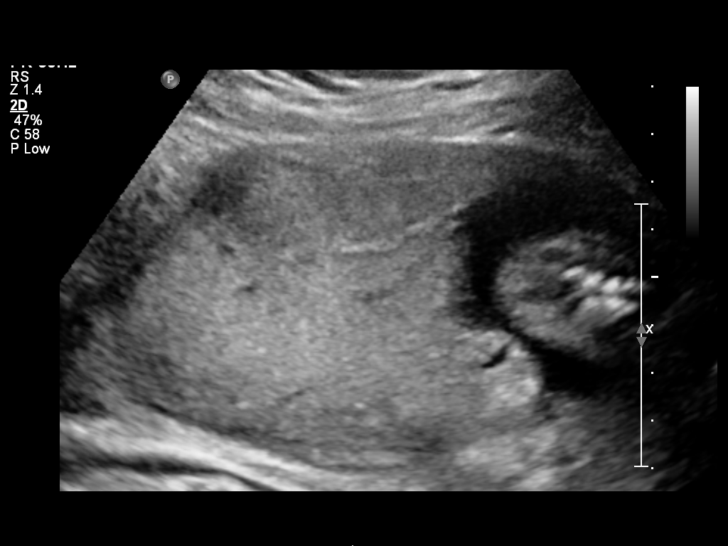
[im 22/72]
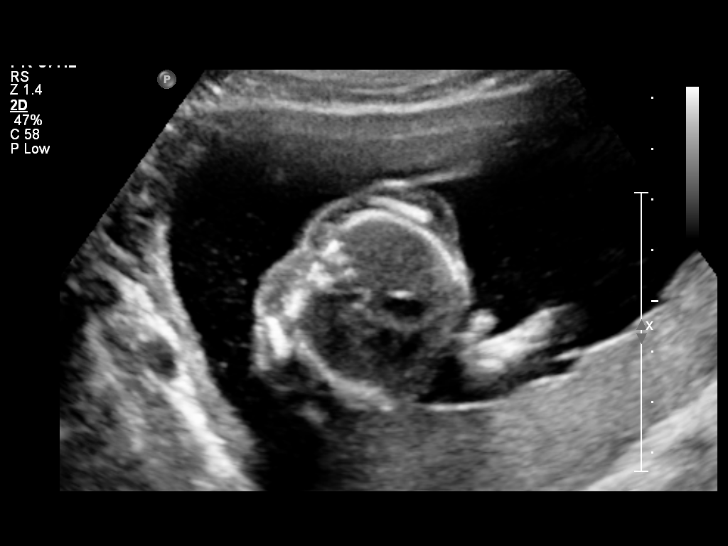
[im 28/72]
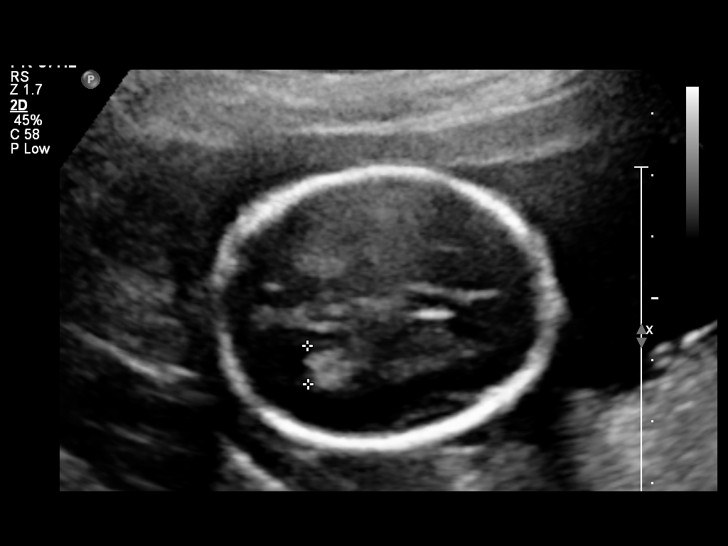
[im 33/72]
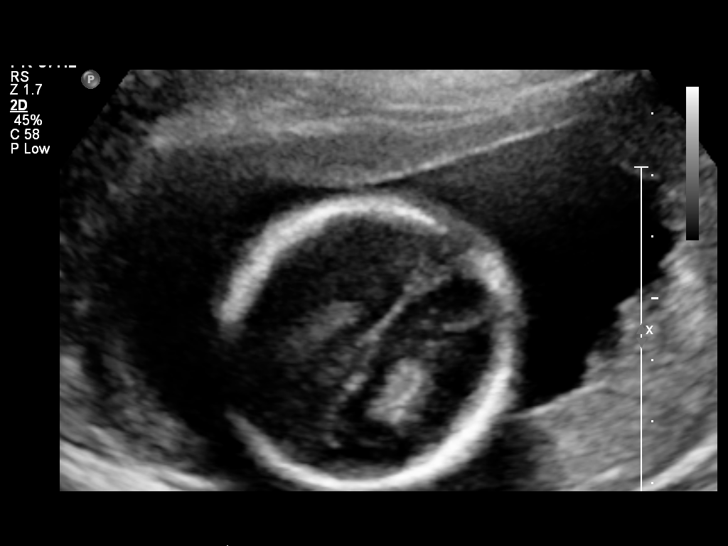
[im 41/72]
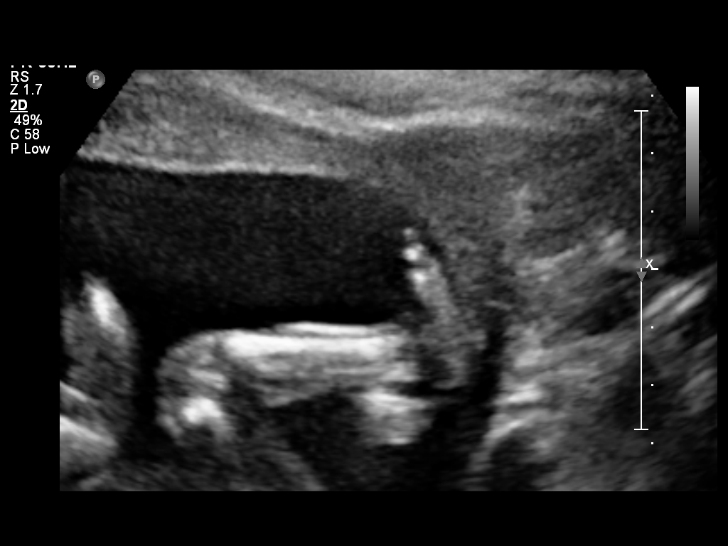
[im 47/72]
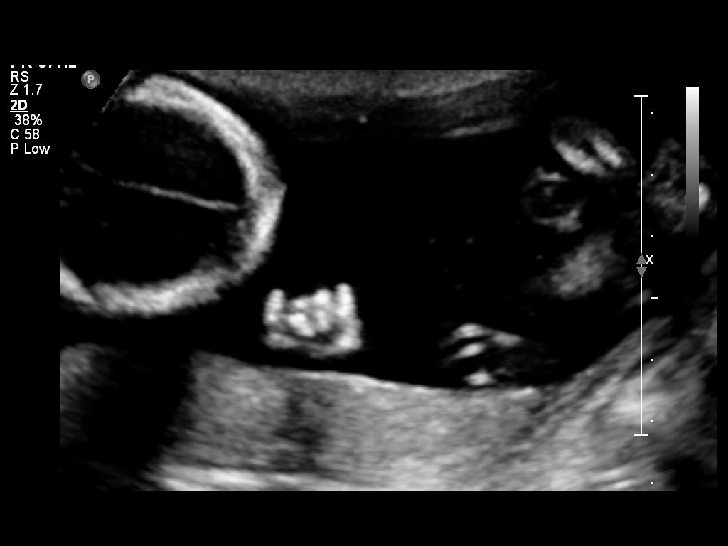
[im 52/72]
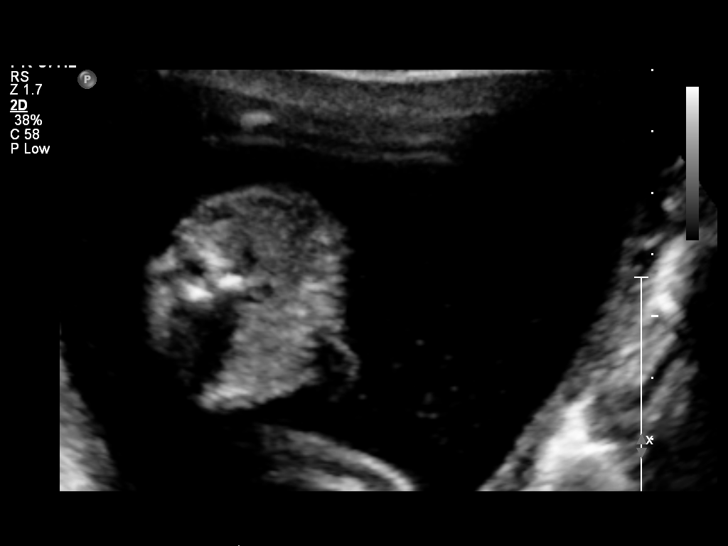
[im 61/72]
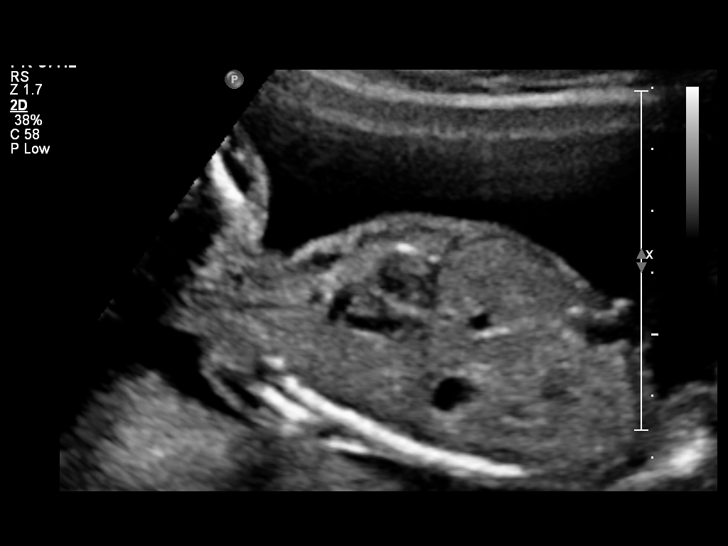
[im 66/72]
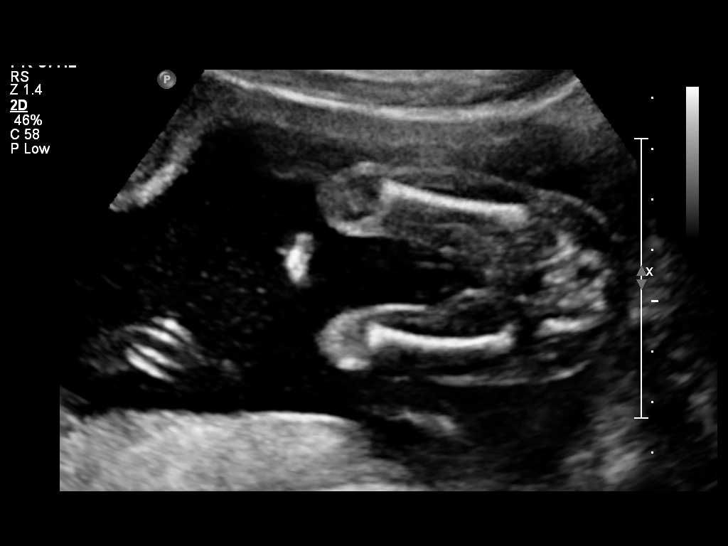
[im 72/72]
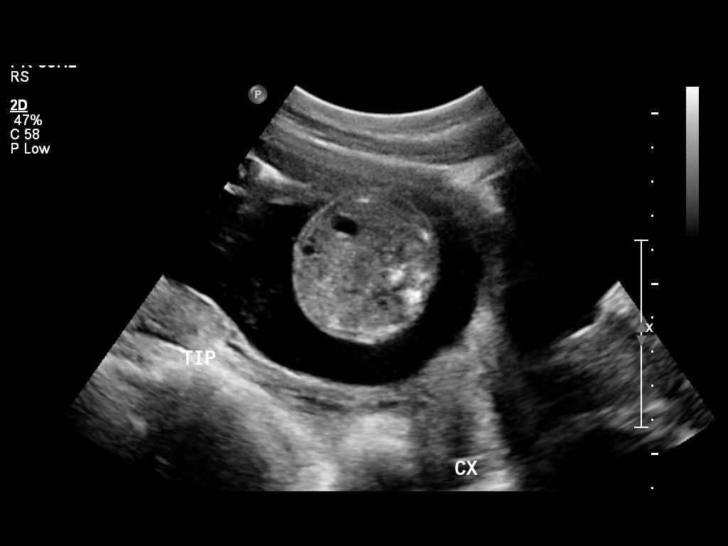

[12 of 28 positions shown; findings below may reference images not displayed]

OBSTETRICS REPORT
                      (Signed Final 09/29/2013 [DATE])

Service(s) Provided

 US OB COMP + 14 WK                                    76805.1
Indications

 Poor obstetric history: Previous preeclampsia /
 eclampsia/gestational HTN
 Basic anatomic survey
Fetal Evaluation

 Num Of Fetuses:    1
 Fetal Heart Rate:  142                          bpm
 Cardiac Activity:  Observed
 Presentation:      Breech
 Placenta:          Posterior, above cervical
                    os
 P. Cord            Visualized, central
 Insertion:

 Amniotic Fluid
 AFI FV:      Subjectively within normal limits
                                             Larg Pckt:     6.7  cm
 RLQ:   6.7     cm
Biometry

 BPD:     44.3  mm     G. Age:  19w 3d                CI:         73.1   70 - 86
                                                      FL/HC:      17.9   16.1 -

 HC:     164.7  mm     G. Age:  19w 1d       59  %    HC/AC:      1.19   1.09 -

 AC:     138.3  mm     G. Age:  19w 2d       59  %    FL/BPD:
 FL:      29.5  mm     G. Age:  19w 1d       52  %    FL/AC:      21.3   20 - 24
 HUM:     28.2  mm     G. Age:  19w 1d       57  %
 NFT:     2.65  mm

 Est. FW:     278  gm    0 lb 10 oz      51  %
Gestational Age

 LMP:           18w 6d        Date:  05/20/13                 EDD:   02/24/14
 U/S Today:     19w 2d                                        EDD:   02/21/14
 Best:          18w 6d     Det. By:  LMP  (05/20/13)          EDD:   02/24/14
Anatomy

 Cranium:          Appears normal         Aortic Arch:      Appears normal
 Fetal Cavum:      Appears normal         Ductal Arch:      Appears normal
 Ventricles:       Appears normal         Diaphragm:        Appears normal
 Choroid Plexus:   Appears normal         Stomach:          Appears normal, left
                                                            sided
 Cerebellum:       Appears normal         Abdomen:          Appears normal
 Posterior Fossa:  Appears normal         Abdominal Wall:   Appears nml (cord
                                                            insert, abd wall)
 Nuchal Fold:      Appears normal         Cord Vessels:     Appears normal (3
                                                            vessel cord)
 Face:             Appears normal         Kidneys:          Appear normal
                   (orbits and profile)
 Lips:             Appears normal         Bladder:          Appears normal
 Heart:            Not well visualized    Spine:            Appears normal
 RVOT:             Not well visualized    Lower             Appears normal
                                          Extremities:
 LVOT:             Appears normal         Upper             Appears normal
                                          Extremities:

 Other:  Female gender. Heels and 5th digit visualized.
Cervix Uterus Adnexa

 Cervical Length:    3.2      cm

 Cervix:       Normal appearance by transabdominal scan.
 Uterus:       No abnormality visualized.

 Left Ovary:    No adnexal mass visualized.
 Right Ovary:   No adnexal mass visualized.
 Adnexa:     No abnormality visualized.
Impression

 IUP at 18+6 weeks
 Normal detailed fetal anatomy; limited heart views
 Markers of aneuploidy: none
 Normal amniotic fluid volume
 Measurements consistent with LMP dating
Recommendations

 Follow-up ultrasound in 4-6 weeks to complete anatomic
 survey

 Thank you for sharing in the care of Ms. SANDIP TIGER with us.
 questions or concerns.

## 2014-03-11 ENCOUNTER — Ambulatory Visit (HOSPITAL_COMMUNITY)
Admission: RE | Admit: 2014-03-11 | Payer: Medicaid Other | Source: Ambulatory Visit | Admitting: Obstetrics and Gynecology

## 2014-03-11 ENCOUNTER — Encounter (HOSPITAL_COMMUNITY): Admission: RE | Payer: Self-pay | Source: Ambulatory Visit

## 2014-03-11 SURGERY — LIGATION, FALLOPIAN TUBE, LAPAROSCOPIC
Anesthesia: Choice | Site: Abdomen | Laterality: Bilateral

## 2014-03-11 MED ORDER — KETOROLAC TROMETHAMINE 30 MG/ML IJ SOLN
INTRAMUSCULAR | Status: AC
  Filled 2014-03-11: qty 1

## 2014-03-11 MED ORDER — DEXAMETHASONE SODIUM PHOSPHATE 10 MG/ML IJ SOLN
INTRAMUSCULAR | Status: AC
  Filled 2014-03-11: qty 1

## 2014-03-11 MED ORDER — ROCURONIUM BROMIDE 100 MG/10ML IV SOLN
INTRAVENOUS | Status: AC
  Filled 2014-03-11: qty 1

## 2014-03-11 MED ORDER — PROPOFOL 10 MG/ML IV EMUL
INTRAVENOUS | Status: AC
  Filled 2014-03-11: qty 20

## 2014-03-11 MED ORDER — NEOSTIGMINE METHYLSULFATE 10 MG/10ML IV SOLN
INTRAVENOUS | Status: AC
  Filled 2014-03-11: qty 1

## 2014-03-11 MED ORDER — ONDANSETRON HCL 4 MG/2ML IJ SOLN
INTRAMUSCULAR | Status: AC
  Filled 2014-03-11: qty 2

## 2014-03-11 MED ORDER — LIDOCAINE HCL (CARDIAC) 20 MG/ML IV SOLN
INTRAVENOUS | Status: AC
  Filled 2014-03-11: qty 5

## 2014-03-11 MED ORDER — FENTANYL CITRATE 0.05 MG/ML IJ SOLN
INTRAMUSCULAR | Status: AC
  Filled 2014-03-11: qty 5

## 2014-03-11 MED ORDER — GLYCOPYRROLATE 0.2 MG/ML IJ SOLN
INTRAMUSCULAR | Status: AC
  Filled 2014-03-11: qty 3

## 2014-03-11 MED ORDER — DIPHENHYDRAMINE HCL 50 MG/ML IJ SOLN
INTRAMUSCULAR | Status: AC
  Filled 2014-03-11: qty 1

## 2014-03-11 MED ORDER — MIDAZOLAM HCL 2 MG/2ML IJ SOLN
INTRAMUSCULAR | Status: AC
  Filled 2014-03-11: qty 2

## 2014-03-11 NOTE — Progress Notes (Signed)
Patient called to cancel procedure

## 2014-04-28 IMAGING — US US OB FOLLOW-UP
1 series · 12 of 28 positions shown · non-contrast
Comparison: none

[Series 1: us ob follow up · 40 acquisitions, 12 frames shown]
[im 2/40]
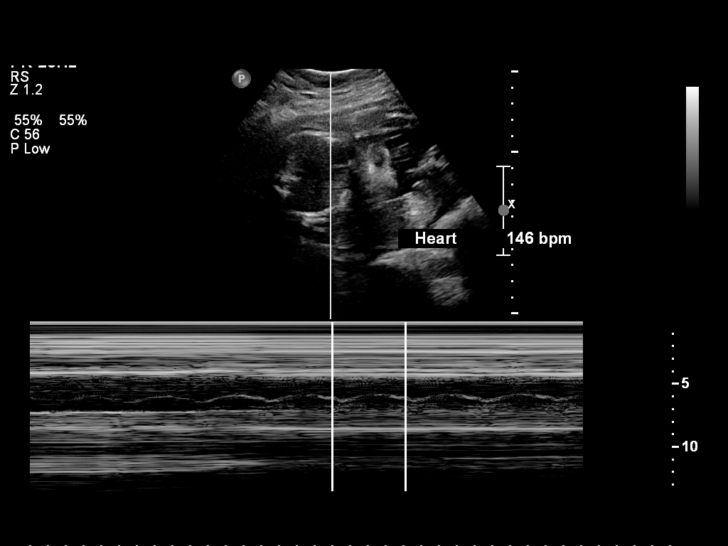
[im 5/40]
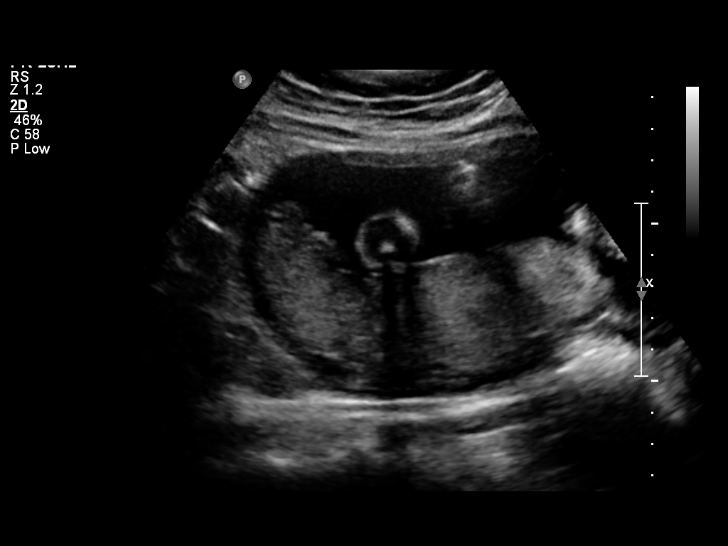
[im 8/40]
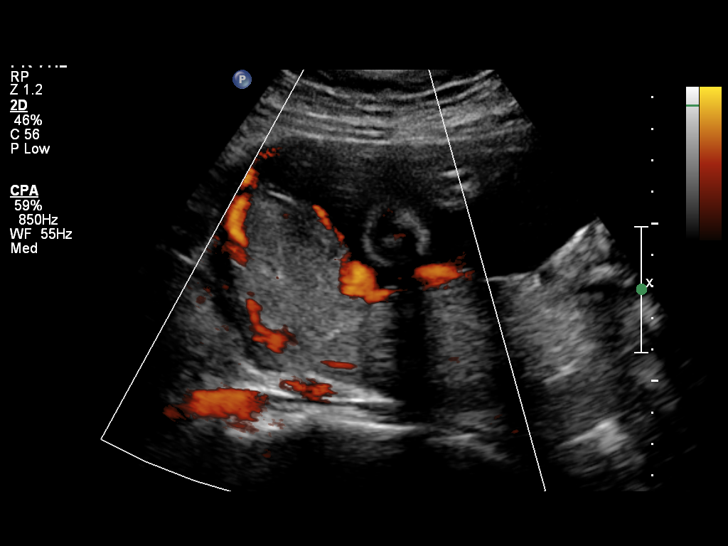
[im 12/40]
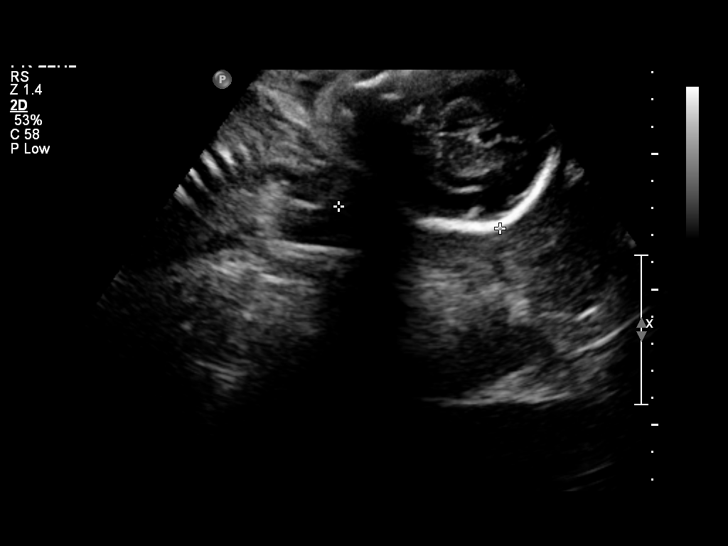
[im 15/40]
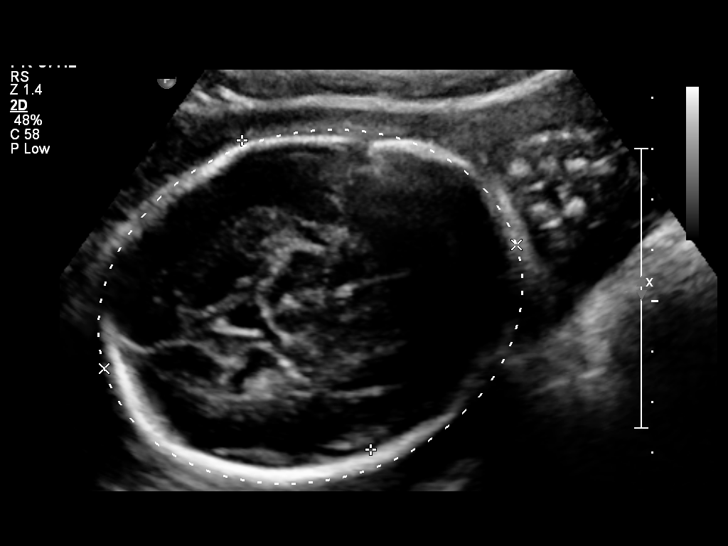
[im 18/40]
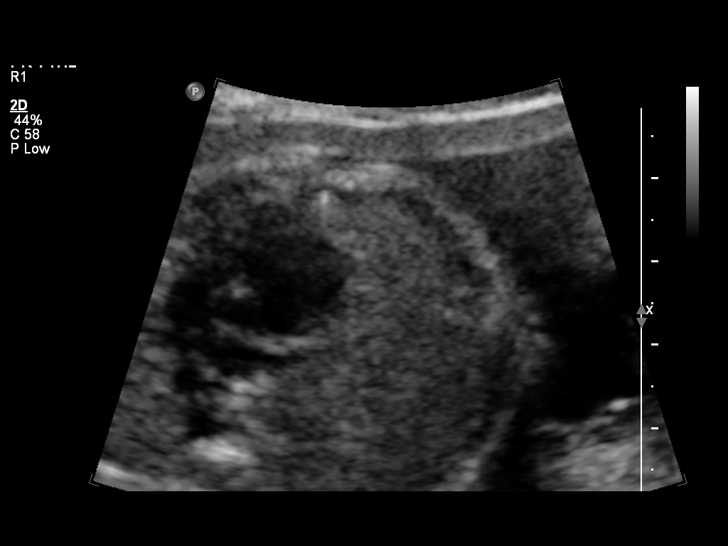
[im 22/40]
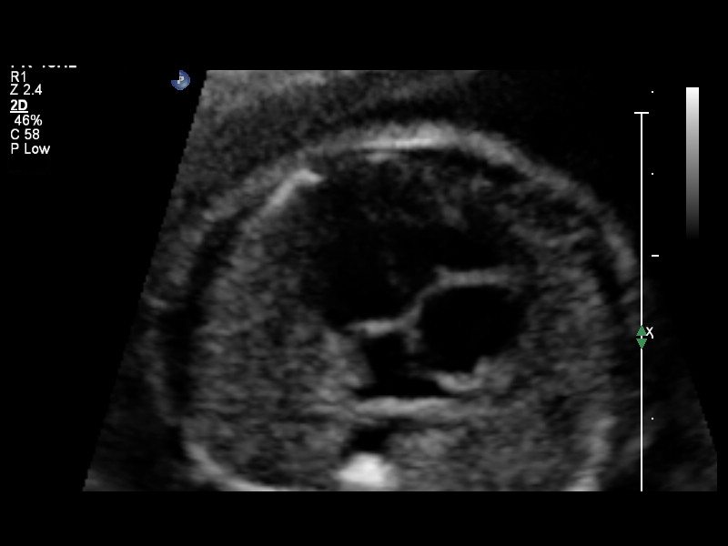
[im 25/40]
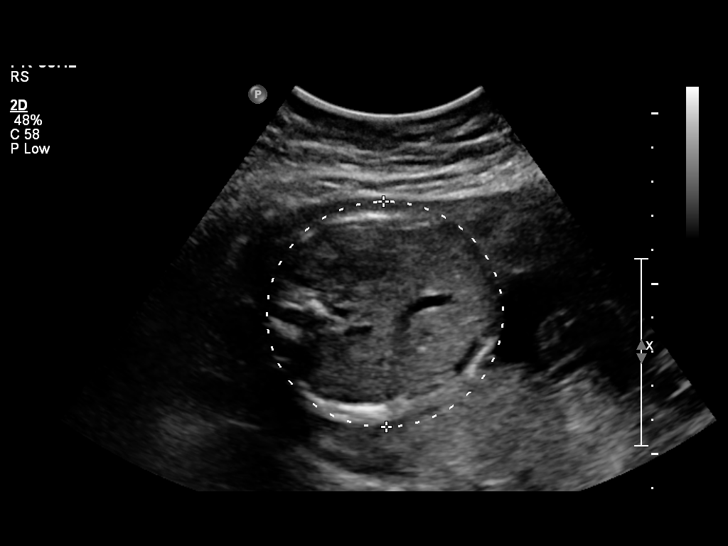
[im 28/40]
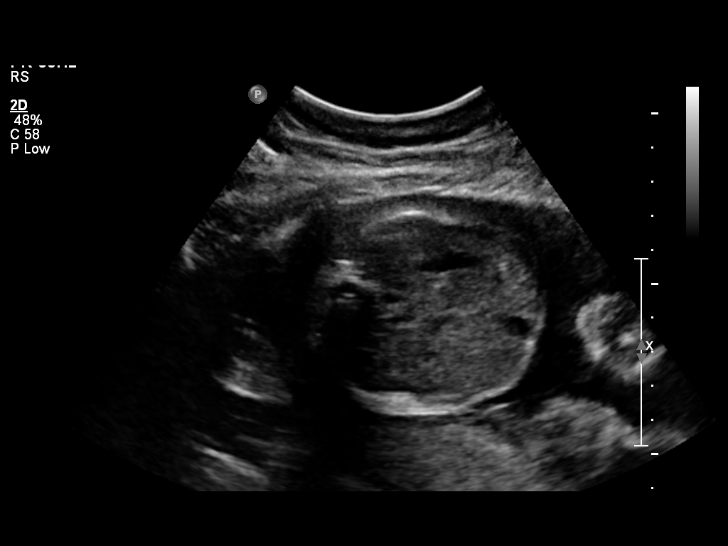
[im 32/40]
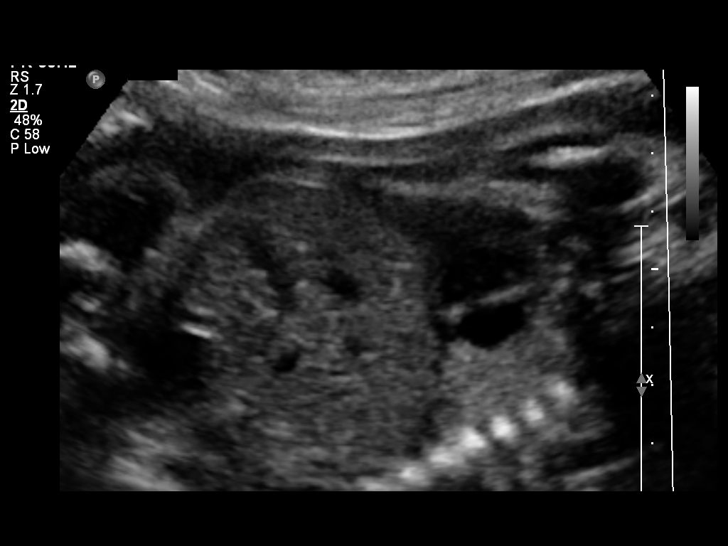
[im 35/40]
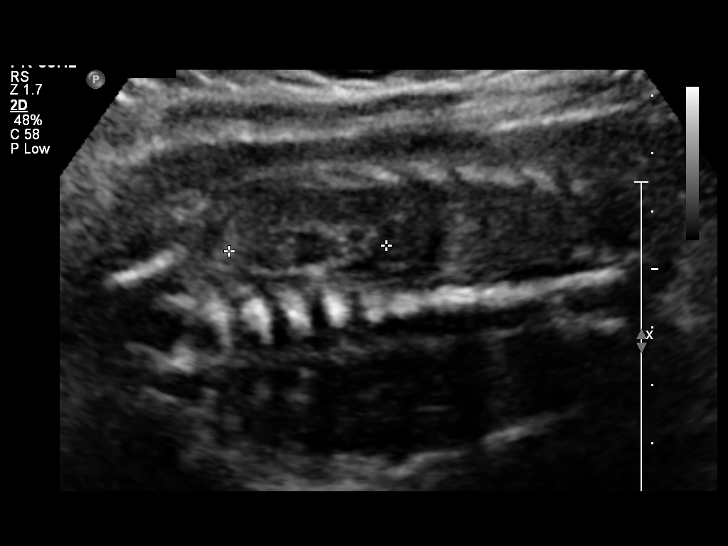
[im 38/40]
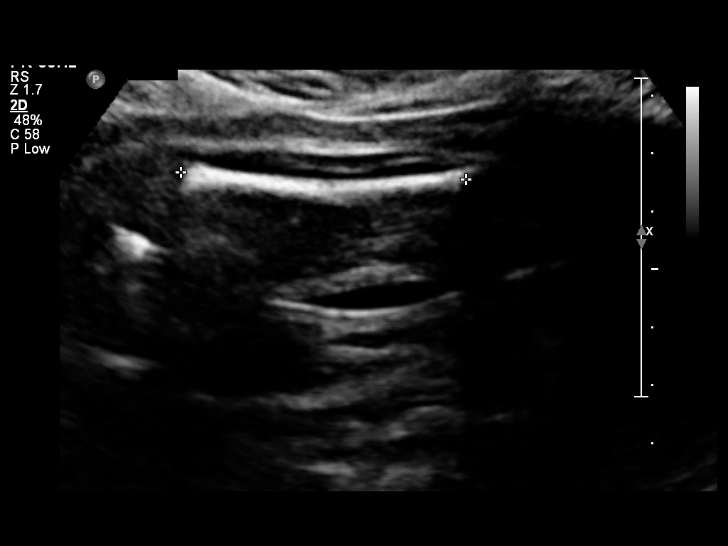

[12 of 28 positions shown; findings below may reference images not displayed]

OBSTETRICS REPORT
                      (Signed Final 11/19/2013 [DATE])

Service(s) Provided

 US OB FOLLOW UP                                       76816.1
Indications

 Follow-up incomplete fetal anatomic evaluation
Fetal Evaluation

 Num Of Fetuses:    1
 Fetal Heart Rate:  146                          bpm
 Cardiac Activity:  Observed
 Presentation:      Cephalic
 Placenta:          Posterior, above cervical
                    os
 P. Cord            Visualized, central
 Insertion:

 Amniotic Fluid
 AFI FV:      Subjectively within normal limits
                                             Larg Pckt:     4.5  cm
Biometry

 BPD:     65.8  mm     G. Age:  26w 4d                CI:        74.16   70 - 86
                                                      FL/HC:      20.4   18.6 -

 HC:     242.6  mm     G. Age:  26w 3d       32  %    HC/AC:      1.14   1.04 -

 AC:     212.8  mm     G. Age:  25w 6d       31  %    FL/BPD:     75.1   71 - 87
 FL:      49.4  mm     G. Age:  26w 5d       52  %    FL/AC:      23.2   20 - 24

 Est. FW:     904  gm           2 lb     55  %
Gestational Age

 LMP:           26w 1d        Date:  05/20/13                 EDD:   02/24/14
 U/S Today:     26w 3d                                        EDD:   02/22/14
 Best:          26w 1d     Det. By:  LMP  (05/20/13)          EDD:   02/24/14
Anatomy

 Cranium:          Appears normal         Aortic Arch:      Previously seen
 Fetal Cavum:      Previously seen        Ductal Arch:      Previously seen
 Ventricles:       Appears normal         Diaphragm:        Appears normal
 Choroid Plexus:   Previously seen        Stomach:          Appears normal, left
                                                            sided
 Cerebellum:       Previously seen        Abdomen:          Appears normal
 Posterior Fossa:  Previously seen        Abdominal Wall:   Previously seen
 Nuchal Fold:      Previously seen        Cord Vessels:     Previously seen
 Face:             Orbits and profile     Kidneys:          Appear normal
                   previously seen
 Lips:             Previously seen        Bladder:          Appears normal
 Heart:            Appears normal         Spine:            Previously seen
                   (4CH, axis, and
                   situs)
 RVOT:             Appears normal         Lower             Previously seen
                                          Extremities:
 LVOT:             Appears normal         Upper             Previously seen
                                          Extremities:

 Other:  Female gender. Heels and 5th digit previously seen.
Targeted Anatomy

 Fetal Central Nervous System
 Lat. Ventricles:
Cervix Uterus Adnexa

 Cervical Length:    3.99     cm

 Cervix:       Normal appearance by transabdominal scan.
Impression

 Single IUP at 26w 1d
 Normal interval anatomy - the anatomic fetal survey is now
 complete
 Fetal growth is appropriate (55th %tile)
 Normal amniotic fluid volume
Recommendations

 Follow-up ultrasounds as clinically indicated.

 Thank you for sharing in the care of Ms. NALA TIGER with us.
 questions or concerns.

## 2014-06-15 ENCOUNTER — Encounter (HOSPITAL_COMMUNITY): Payer: Self-pay | Admitting: *Deleted

## 2014-08-04 ENCOUNTER — Emergency Department: Payer: Self-pay | Admitting: Emergency Medicine

## 2014-12-04 NOTE — Consult Note (Signed)
Referral Information:  Reason for Referral BRIEF CONSULT NOTE (pt referred immediately to labor and delivery for severe range blood pressures)   24 yo EDD 03/25/13 (by LMP c/s second trimester us, per pt)  G6 [id number] with worsening hypertension on labetalol 400mg  bid.  She denies chronic hypertension and has h/o 34w delviery in setting of severe preeclampsia and a 27 week demise (IUGR). Today she c/o headache, epigastric pain and has bp of 174/108   [facility]   Prenatal Hx as above   Past Obstetrical Hx 2002, female, 34 weeks, Induction due to severe preeclampsia, [facility] (2010, 2011, 2011) 2011 IUFD, pt states was 27w but fetal size c/w 24w, oligohydramnios   Home Medications: Medication Instructions Status  labetalol 400 milligram(s) orally 2 times a day Active  Prena1 Prenatal Multivitamins with Folic Acid 1 mg oral tablet, chewable 1 tab(s) orally once a day Active   Allergies:   No Known Allergies:   Vital Signs/Notes:  Nursing Vital Signs: **Vital Signs.:   07-Jul-14 09:16  Vital Signs Type Routine  Temperature Temperature (F) 97.6  Celsius 36.4  Temperature Source oral  Pulse Pulse 87  Respirations Respirations 18  Systolic BP Systolic BP 188  Diastolic BP (mmHg) Diastolic BP (mmHg) 116  Mean BP 140  BP Source  if not from Vital Sign Device non-invasive    09:26  Vital Signs Type Recheck  Systolic BP Systolic BP 174  Diastolic BP (mmHg) Diastolic BP (mmHg) 108  Mean BP 130   Perinatal Consult:  PMed Hx Rubella Immune   Past Medical History cont'd asthma   PSurg Hx tonsilectomy, l hip surgery   Soc Hx denies tobacco us    Additional Lab/Radiology Notes on exam: no ruq tendernes, ?epigastric tenderness, +2 le edema bilaterally   Impression/Recommendations:  Impression 34/5 weeks with severe gestational hypertension or severe preeclampsia -pt referred to labor and delivery immediately after blood pressure readings  obtained and clinical symptoms (headache, epigastric pain) reported   Recommendations Recommend delivery due to gestational age 25/6 and either gestational hypertension(severe) or severe preclampsia  -if she is not delivered (due to normalization of bp, no evidence of HELLP syndrome, and  improvement in her clinical symptoms) I would recommend close, in house observation of her blood pressure, clinical symptoms and a 24 hour urine prrotein. -HELLP labs, ECG maybe helpful to discern whether she has chronic hypertension (eg. if LVH present, more c/w chronic hypertension)    Total Time Spent with Patient 15 minutes   >50% of visit spent in couseling/coordination of care yes   Office Use Only [id number]  Level 1 (15min) NEW office consult prob focused   Coding Description: MATERNAL CONDITIONS/HISTORY INDICATION(S).   History of prior preg w/ FGR/IUGR.   Pre-eclampsia, severe.  Electronic Signatures: Consuelo PandySmall, Deshonda Cryderman (MD)  (Signed 07-Jul-14 10:00)  Authored: Referral, Home Medications, Allergies, Vital Signs/Notes, Consult, Lab/Radiology Notes, Impression, Billing, Coding Description   Last Updated: 07-Jul-14 10:00 by Consuelo PandySmall, Vanilla Heatherington (MD)

## 2014-12-04 NOTE — Consult Note (Signed)
   Gravida 6   Para 0   Term Deliveries 0   Preterm Deliveries 2   Abortions 3   Living Children 1   Final EDD (dd-[id number]) 25-Mar-2013   Blood Type (Maternal) A positive   Antibody Screen Results (Maternal) negative   HIV Results (Maternal) negative   Gonorrhea Results (Maternal) negative   Chlamydia Results (Maternal) negative   VDRL/RPR/Syphilis Results (Maternal) negative   Varicella Titer Results (Maternal) Positive   Rubella Results (Maternal) immune   Hepatitis B Surface Antigen Results (Maternal) negative   Group B Strep Results Maternal (Result >5wks must be treated as unknown) unknown/result > 5 weeks ago    Additional Comments Spoke with mother and FOB about possible need for SCN depending on patient's weight and condition at birth.  Discussed possible need for respiratory support and prolonged hospital care for help with feedings, temp regulation, etc.  They were attentive and appreciative of my input.  Face-to-face time - 20 minutes   Electronic Signatures: Serita GritWimmer, Daschel Roughton E (MD)  (Signed 07-Jul-14 18:32)  Authored: PREGNANCY and LABOR, ADDITIONAL COMMENTS   Last Updated: 07-Jul-14 18:32 by Serita GritWimmer, Alyah Boehning E (MD)

## 2014-12-05 NOTE — Op Note (Signed)
PATIENT NAME:  Alicia Friedman, LAFFEY MR#:  [id number] DATE OF BIRTH:  1991-04-23  DATE OF PROCEDURE:  01/25/2014  PREOPERATIVE DIAGNOSES:  1. Intrauterine pregnancy at 35 weeks 5 days gestational age.  2. Placental abruption.   POSTOPERATIVE DIAGNOSES: 1. Intrauterine pregnancy at 35 weeks 5 days gestational age.  2. Placental abruption.   PROCEDURE:  Emergency primary low transverse cesarean section via Pfannenstiel incision.   ANESTHESIA:  General.  SURGEON: Thomasene Mohair, MD    ESTIMATED BLOOD LOSS: 1000 mL.   OPERATIVE FLUIDS: 2000 mL crystalloid.   COMPLICATIONS: None.   FINDINGS:  1. Normal-appearing gravid uterus, fallopian tubes, and ovaries.  2. No Couvelaire uterus noted at the time of delivery.   SPECIMENS: placenta.   INDICATION FOR PROCEDURE: The patient is a 24 year old female, gravida 6, para 2 who presented to labor and delivery at approximately 12:07 p.m. today with heavy vaginal bleeding. She arrived by EMS. She was quickly interviewed and assessed and found to have significant vaginal bleeding. Attempt to obtain fetal heart tones was undertaken with a possible heart tone in the 70s, though this could not be definitively differentiated from the maternal heartbeat. Given this finding, the patient was emergently taken to the operating room. She arrived in the operating room at about 12:15 and incision was at 12:22. The baby was delivered at12:24.   PROCEDURE IN DETAIL: The patient was taken to the operating room as described above and placed in the dorsal supine position with a leftward tilt. She was placed under general anesthesia as soon as anesthesia arrived. Prior to anesthesia's arrival, the patient had been prepped using Betadine and the drape had been already placed as well as Foley catheter. A Pfannenstiel incision was made and entry was gained into the abdomen using Pelosi maneuvers.  Bladder retractor was placed, pulled the bladder out of the operative area of  interest. A low transverse hysterotomy was made with the scalpel and the hysterotomy was extended laterally with cranial and caudal tension. The fetal vertex was grasped and elevated to the hysterotomy. The fetal vertex followed by the shoulders and the rest of the body were delivered without difficulty with the assist of fundal pressure. The cord was clamped and cut and the infant was handed to the pediatrician who is a neonatologist.  A cord segment was collected for cord gas. Cord blood was collected due to unknown maternal blood type at the time. The placenta delivered spontaneously intact with a 3 vessel cord. The uterus was exteriorized and cleared of all clots and debris. There was no Couvelaire sign that was very obvious. Of note, there did appear to be old blood and clot type material within the uterus to suggest abruption. After clearance of the uterus, the hysterotomy was closed using #0 Vicryl in a running locked fashion. A second layer was placed in an imbricating fashion. Several additional figure-of-eight sutures had to be thrown in order to obtain hemostasis. Once hemostasis was obtained, the uterus was inspected as well as the fallopian tubes and ovaries with the above-noted findings. Evacuation of all fluids and blood from the abdominal cavity was undertaken and the uterus was returned to the abdomen. The gutters were then cleared of all clots and debris. Hemostasis was noted, and the peritoneum was reapproximated using #0 Vicryl. The fascia was closed using #0 PDS that was looped. After irrigation of the subcutaneous tissue, the tissue was reapproximated to maintain minimal tension on the skin closure. No greater than 2 cm of dead space remained  at this point in terms of depth of the subcutaneous tissue at closure. The skin was closed using staples.   The patient tolerated the procedure well. Sponge, lap, and needle counts were not able to be undertaken, and so, per protocol, an x-ray was taken  in the operating room prior to full closure. The x-ray was found to be negative for any retained sponges or instruments or needles. Of note, the patient did have a pin in her left hip from prior surgery. The patient did receive antibiotics intraoperatively though not before skin incision given the emergent nature of the procedure. She received 2 grams of cefazolin during the procedure. The patient did have a Foley catheter placed at the beginning of the procedure and only had a minimal amount of urine at the end of the procedure, approximately 5 mL. She was awakened in the operating room and taken to the recovery area in stable condition.    ____________________________ Conard NovakStephen D. Lennyx Verdell, MD sdj:dd D: 01/25/2014 15:10:32 ET T: 01/25/2014 20:05:07 ET JOB#: [id number]  cc: Conard NovakStephen D. Ethne Jeon, MD, <Dictator> Conard NovakSTEPHEN D Ardon Franklin MD ELECTRONICALLY SIGNED 02/03/2014 21:58

## 2014-12-05 NOTE — Op Note (Signed)
PATIENT NAME:  Alicia MassonFOLK, Alicia Friedman MR#:  [id number] DATE OF BIRTH:  13-Oct-1990  DATE OF PROCEDURE:  01/26/2014  PREOPERATIVE DIAGNOSIS: Excessive bleeding from incision site.   POSTOPERATIVE DIAGNOSIS: Excessive bleeding from incision site.    PROCEDURE: Wound exploration and repair.   ANESTHESIA: General.   SURGEON: Thomasene MohairStephen Lysha Schrade, MD   ESTIMATED BLOOD LOSS: 10 mL.  OPERATIVE FLUIDS: 850 mL crystalloid.   COMPLICATIONS: None.   FINDINGS: Multiple areas of bleeding at multiple depths within the incision.   SPECIMENS: None.   CONDITION AT THE END OF PROCEDURE: Stable.   PROCEDURE IN DETAIL: The patient was taken to the operating room where general anesthesia was administered and found to be adequate. She was placed in the dorsal supine position and prepped and draped in the usual sterile fashion. After a timeout was called, all staples were removed from the previous incision line and any suture in the subcutaneous area was removed. The fascia was inspected and found to be intact. There was bleeding from, in general, throughout the incision site and each very obvious bleeding site was cauterized. Two layers of closure were undertaken using a #3-0 Vicryl using interrupted stitches such that no bleeding was obvious and at the edges of the skin incision there were areas that needed to be cauterized using monopolar electrocautery. At the end of procedure, there was complete hemostasis noted, and the skin was reclosed with staples. The incision was watched for approximately 5 minutes and no bleeding came from the incision site without any pressure applied. A pressure dressing was applied at the end of the procedure to ensure continued hemostasis.   The patient tolerated the procedure well. Sponge, lap, and needle counts were correct x 2. For VTE prophylaxis, the patient was wearing pneumatic compression stockings. For antibiotic prophylaxis, she was given 2 grams of Ancef prior to skin incision. She was  taken to the recovery area in stable condition.    ____________________________ Conard NovakStephen D. Deondria Puryear, MD sdj:lt D: 01/26/2014 02:26:33 ET T: 01/26/2014 03:36:15 ET JOB#: [id number]  cc: Conard NovakStephen D. Cesare Sumlin, MD, <Dictator> Conard NovakSTEPHEN D Sarika Baldini MD ELECTRONICALLY SIGNED 02/03/2014 21:59

## 2014-12-05 NOTE — Consult Note (Signed)
Brief Consult Note: Diagnosis: Possible DIC.   Patient was seen by consultant.   Consult note dictated.   Orders entered.   Discussed with Attending MD.   Comments: 22y/oF with abruptio placenta and had emergency C section this afternoon- with increased bleeding from c-section sutures  * Possible DIC- with anemia, thrombocytopenia and also decreased fibrinogen and increased pt and aptt abruptio placenta is a risk factor for that transfer to ICU, will need close monitoring received 2units ffp, transfuse now with 2units prbc and recheck plt- if further dropping- conider plt tranfusion as well repeat cbc and fibrinogen check appreciate obgyn input monitor for any end organ complications of DIC- esp liver and kidneys check cmp in am will follow along if worsening- consider transfer to teritiary care center.  Electronic Signatures: Enid BaasKalisetti, Akayla Brass (MD)  (Signed 14-Jun-15 21:19)  Authored: Brief Consult Note   Last Updated: 14-Jun-15 21:19 by Enid BaasKalisetti, Santiaga Butzin (MD)

## 2014-12-05 NOTE — Consult Note (Signed)
PATIENT NAME:  Alicia Friedman, Alicia Friedman MR#:  [id number] DATE OF BIRTH:  [birth year]  DATE OF CONSULTATION:  01/25/2014  CONSULTING PHYSICIAN:  Enid Baas, MD  ADMITTING PHYSICIAN: Dr. Thomasene Mohair   CONSULTING PHYSICIAN: Enid Baas, MD  PRIMARY CARE PHYSICIAN: [facility]  REASON FOR CONSULTATION: Bleeding.   HISTORY OF PRESENT ILLNESS: Ms. Thelen is a 24 year old African American female with past medical history significant for asthma, history of sick cell trait, preeclampsia during previous pregnancies who is [redacted] weeks gestation and 5 days. Today comes to the Emergency Room with heavy vaginal bleeding. She was diagnosed to have abruptio placentae and was immediately taken for a C-section. Post C-section, the patient continues to have increased bleeding from the incisions site of the C-section, not the vaginal bleeding as much. Labs ordered for increased bleeding show that her hemoglobin was 7.8. She had elevated PT, PTT and also decreased fibrinogen at 96 so medical consult has been requested for possible DIC management.   PAST MEDICAL HISTORY:  1. Preeclampsia during previous pregnancies, none currently.  2. Asthma.  3. Obesity.  4. Sickle cell trait.   PAST SURGICAL HISTORY:  1. 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  2. A C-section.   ALLERGIES: No known drug allergies.   CURRENT HOME MEDICATIONS:  1. Takes Flexeril p.r.n. for pain.  2. Prenatal vitamin.  3. Aspirin 81 mg that was started during her pregnancy.   SOCIAL HISTORY: Lives at home with her family, has 2 prior children. No current smoking or alcohol use. However, drug screen positive for marijuana.   FAMILY HISTORY: Not significant.  REVIEW OF SYSTEMS:  CONSTITUTIONAL: No fever, fatigue or weakness.  EYES: No blurred vision, double vision, inflammation, or glaucoma.  ENT: No tinnitus, ear pain, hearing loss, epistaxis, or discharge.  RESPIRATORY: No cough, wheeze, hemoptysis, or COPD.  Positive for asthma.  CARDIOVASCULAR: No chest pain, orthopnea, edema, arrhythmia, palpitations, or syncope.  GASTROINTESTINAL: No nausea, vomiting, diarrhea, diarrhea, hematemesis, or melena. Positive for abdominal pain.  GENITOURINARY: No dysuria, hematuria, renal calculus, or frequency. Positive for oliguria since surgery.  ENDOCRINE: No polyuria, nocturia, or thyroid problems.  HEMATOLOGY: Positive for bleeding and anemia. SKIN: No acne, rash, or lesions. MUSCULOSKELETAL: Positive for low back pain during pregnancy. NEUROLOGIC: No numbness, weakness, CVA, TIA, or seizures.  PSYCHOLOGICAL: No anxiety, insomnia, or depression.   PHYSICAL EXAMINATION:  VITAL SIGNS: Temperature 98.4 degrees Fahrenheit, pulse 109, respirations 18, blood pressure 150/98, pulse ox 100% on room air.  GENERAL: Well-built, well-nourished female lying in bed, not in any acute distress.  HEENT: Normocephalic, atraumatic. Pupils equal, round, reacting to light. Anicteric sclerae. Extraocular movements intact.  OROPHARYNX: Clear without erythema, mass, or exudates.  NECK: Supple. No thyromegaly, JVD, or carotid bruits. No lymphadenopathy.  LUNGS: Moving air bilaterally. No wheeze or crackles. No use of accessory muscles for breathing.  CARDIOVASCULAR: S1, S2, regular rate and rhythm. No murmurs, rubs, or gallops. ABDOMEN: Obese, tender, dressing in place, abdominal binder from her C-section, and dressing is saturated with blood at this time. Hypoactive bowel sounds.  EXTREMITIES: No pedal edema. No clubbing or cyanosis, 2+ dorsalis pedis pulses palpable bilaterally.  SKIN: No acne, rash, or lesions.  LYMPHATICS: No cervical lymphadenopathy.  NEUROLOGIC: Cranial nerves are intact. No focal motor or sensory deficits.  PSYCHOLOGICAL: The patient is awake, alert, oriented x 3.   LABORATORY DATA: Urine tox screen positive for marijuana and also benzodiazepines. Fibrinogen initially is 96. Repeat fibrinogen after 2 units  of  FFP is 143. Initial PTT was elevated at 38.5. After transfusion, PTT is down to 30.1. INR was 1.5, now at 1.3. WBC 21.4, hemoglobin 5.7, hematocrit 16.7, platelet count is 115,000.   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 Assessment for possible disseminated intravascular coagulation.  1. Possible disseminated intravascular coagulation with anemia and thrombocytopenia, decreased fibrinogen and increased PT and PTT. Abruptio placentae is a risk factor for that. Will transfer the patient to ICU for close monitoring. She already received 2 units of FFP. Now, we will transfuse 2 units of packed red blood cells and recheck platelet count, PTT, PT. Can start platelet transfusion if platelets less than 100,000 with bleeding or 50,000 and repeat CBC, fibrinogen check in morning. PT, PTT again. Appreciate OB/GYN input. Monitor for any end organ complications, especially liver and kidneys. Check complete metabolic panel in a.m. We will follow along. If worsening, consider transfer to tertiary care center.   TOTAL CRITICAL CARE TIME SPENT ON CONSULTATION: 60 minutes.    ____________________________ Enid Baasadhika Tyshun Tuckerman, MD rk:lt D: 01/25/2014 21:20:00 ET T: 01/25/2014 22:32:04 ET JOB#: [id number]  cc: Enid Baasadhika Blyss Lugar, MD, <Dictator> Enid BaasADHIKA Kaushik Maul MD ELECTRONICALLY SIGNED 02/02/2014 16:08

## 2014-12-17 ENCOUNTER — Encounter: Payer: Self-pay | Admitting: Emergency Medicine

## 2014-12-17 ENCOUNTER — Other Ambulatory Visit: Payer: Self-pay

## 2014-12-17 ENCOUNTER — Emergency Department
Admission: EM | Admit: 2014-12-17 | Discharge: 2014-12-17 | Discharge status: Against Medical Advice | Payer: Medicaid Other | Attending: Student | Admitting: Student

## 2014-12-17 DIAGNOSIS — Z3202 Encounter for pregnancy test, result negative: Secondary | ICD-10-CM | POA: Diagnosis not present

## 2014-12-17 DIAGNOSIS — N939 Abnormal uterine and vaginal bleeding, unspecified: Secondary | ICD-10-CM | POA: Diagnosis present

## 2014-12-17 DIAGNOSIS — Z87891 Personal history of nicotine dependence: Secondary | ICD-10-CM | POA: Diagnosis not present

## 2014-12-17 HISTORY — DX: History of uterine scar from previous surgery: Z98.891

## 2014-12-17 LAB — HCG, QUANTITATIVE, PREGNANCY: hCG, Beta Chain, Quant, S: <1 m[IU]/mL (ref <5)

## 2014-12-17 LAB — COMPREHENSIVE METABOLIC PANEL
ALK PHOS: 52 U/L (ref 38–126)
ALT: 9 U/L — AB (ref 14–54)
ANION GAP: 9 (ref 5–15)
AST: 18 U/L (ref 15–41)
Albumin: 4.1 g/dL (ref 3.5–5.0)
BILIRUBIN TOTAL: 1.4 mg/dL — AB (ref 0.3–1.2)
BUN: 8 mg/dL (ref 6–20)
CHLORIDE: 109 mmol/L (ref 101–111)
CO2: 22 mmol/L (ref 22–32)
Calcium: 9.1 mg/dL (ref 8.9–10.3)
Creatinine, Ser: 0.71 mg/dL (ref 0.44–1.00)
GFR calc non Af Amer: >60 mL/min (ref >60)
GLUCOSE: 76 mg/dL (ref 65–99)
POTASSIUM: 3.7 mmol/L (ref 3.5–5.1)
Sodium: 140 mmol/L (ref 135–145)
Total Protein: 7.3 g/dL (ref 6.5–8.1)

## 2014-12-17 LAB — LIPASE, BLOOD: Lipase: 24 U/L (ref 22–51)

## 2014-12-17 NOTE — ED Notes (Signed)
Updated pt on current care plan. Vitals taken.

## 2014-12-17 NOTE — ED Provider Notes (Signed)
[facility] Emergency Department Provider Note    ____________________________________________  Time seen: 2:15 PM  I have reviewed the triage vital signs and the nursing notes.   HISTORY  Chief Complaint Vaginal Bleeding       HPI Alicia Friedman is a 24 y.o. female who presents with vaginal bleeding that began today. She reports this is 3-4 days earlier than expected because her periods are usually very regular. She does complain of mild pelvic cramping. No dizziness no lightheadedness. She describes the blood flow as moderate with 2 clots noted. No fevers chills. Took a pregnancy test last week that was negative.     Past Medical History  Diagnosis Date  . Pre-eclampsia, delivered     not currently  . H/O: cesarean section 01/25/2014    Patient Active Problem List   Diagnosis Date Noted  . Depression 02/27/2014  . Desires Sterilization, papers singed 11/12/13 08/11/2013    Past Surgical History  Procedure Laterality Date  . 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 Tonsillectomy and adenoidectomy      Current Outpatient Rx  Name  Route  Sig  Dispense  Refill  . acetaminophen-codeine (TYLENOL #3) 300-30 MG per tablet   Oral   Take 2 tablets by mouth every 4 (four) hours as needed for moderate pain.         Marland Kitchen. albuterol (PROVENTIL HFA;VENTOLIN HFA) 108 (90 BASE) MCG/ACT inhaler   Inhalation   Inhale 2 puffs into the lungs every 6 (six) hours as needed for wheezing or shortness of breath.           Allergies Shellfish allergy  Family History  Problem Relation Age of Onset  . Diabetes Mother   . Hypertension Mother   . Lupus Mother   . Thyroid disease Mother   . Cancer Maternal Grandmother     ovarian    Social History History  Substance Use Topics  . Smoking status: Former Smoker -- 1.00 packs/day    Types: Cigarettes    Quit date: 12/17/2014  . Smokeless tobacco: Never Used  . Alcohol Use: Not on file     Review of Systems  Constitutional: Negative for fever. Eyes: Negative for visual changes. ENT: Negative for sore throat. Cardiovascular: Negative for chest pain. Respiratory: Negative for shortness of breath. Gastrointestinal: Negative for abdominal pain, vomiting and diarrhea. Genitourinary: Negative for dysuria. Positive for vaginal bleeding Musculoskeletal: Negative for back pain. Skin: Negative for rash. Neurological: Negative for headaches, focal weakness or numbness. Psychiatric: Positive for anxiety  10-point ROS otherwise negative.  ____________________________________________   PHYSICAL EXAM:  VITAL SIGNS: ED Triage Vitals  Enc Vitals Group     BP 12/17/14 1324 135/100 mmHg     Pulse Rate 12/17/14 1324 77     Resp --      Temp 12/17/14 1324 98.8 F (37.1 C)     Temp Source 12/17/14 1324 Oral     SpO2 12/17/14 1324 99 %     Weight 12/17/14 1324 175 lb 11.3 oz (79.7 kg)     Height 12/17/14 1324 5\' 2"  (1.575 m)     Head Cir --      Peak Flow --      Pain Score 12/17/14 1326 8     Pain Loc --      Pain Edu? --      Excl. in GC? --      Constitutional: Alert and oriented. Well appearing  and in no distress. Anxious Eyes: Conjunctivae are normal. PERRL. Normal extraocular movements. ENT   Head: Normocephalic and atraumatic.   Nose: No congestion/rhinnorhea.   Mouth/Throat: Mucous membranes are moist.   Neck: No stridor. Hematological/Lymphatic/Immunilogical: No cervical lymphadenopathy. Cardiovascular: Normal rate, regular rhythm. Normal and symmetric distal pulses are present in all extremities. No murmurs, rubs, or gallops. Respiratory: Normal respiratory effort without tachypnea nor retractions. Breath sounds are clear and equal bilaterally. No wheezes/rales/rhonchi. Gastrointestinal: Soft and nontender. No distention. No abdominal bruits. There is no CVA tenderness. Genitourinary: Deferred Musculoskeletal: Nontender with normal range of  motion in all extremities. No joint effusions.  No lower extremity tenderness nor edema. Neurologic:  Normal speech and language. No gross focal neurologic deficits are appreciated. Speech is normal. No gait instability. Skin:  Skin is warm, dry and intact. No rash noted. Psychiatric: Mood and affect are normal. Speech and behavior are normal. Patient exhibits appropriate insight and judgment.  ____________________________________________    LABS (pertinent positives/negatives)  Pending  ____________________________________________   EKG   Date: 12/17/2014  Rate: 82  Rhythm: normal sinus rhythm  QRS Axis: normal  Intervals: normal  ST/T Wave abnormalities: normal  Conduction Disutrbances: none  Narrative Interpretation: unremarkable      ____________________________________________    RADIOLOGY  None  ____________________________________________   PROCEDURES  Procedure(s) performed: None  Critical Care performed: No  ____________________________________________   INITIAL IMPRESSION / ASSESSMENT AND PLAN / ED COURSE  Pertinent labs & imaging results that were available during my care of the patient were reviewed by me and considered in my medical decision making (see chart for details).  Initial impression is that patient is very well-appearing and in no acute distress. She has a benign exam. Pending lab results and pregnancy test if unremarkable patient will be discharged  ____________________________________________  ----------------------------------------- 3:41 PM on 12/17/2014 -----------------------------------------  Signed out to Dr. Inocencio HomesGayle, pending labs, suspect d/c likely  FINAL CLINICAL IMPRESSION(S) / ED DIAGNOSES  Vaginal bleeding  Jene Everyobert Montae Stager, MD 12/17/14 [phone]

## 2014-12-17 NOTE — ED Notes (Signed)
Pt angry with ED staff for not updating. Pt ambulated independently and with no difficulty to nursing station and reported needing a Advertising account plannerphone charger. Upon learning that the only charger is in ED lobby pt reported " Well I will go to the lobby then, I have been sitting here for 4 hours anyways." Pt ambulated into lobby independently. MD and RN notified.

## 2014-12-17 NOTE — ED Notes (Signed)
Pt walked out disgruntled and talking to lawyer. Security behind patient

## 2014-12-17 NOTE — ED Provider Notes (Addendum)
-----------------------------------------   5:35 PM on 12/17/2014 -----------------------------------------  Assuming care from Dr. Cyril LoosenKinner pending labs.  In short, Alicia Friedman is a 24 y.o. female with a chief complaint of Vaginal Bleeding .  Refer to the original H&P for additional details.  I was called to the bedside a few minutes ago. The patient was very upset reported that she had been sitting here "waiting for labs for almost 4 hours when I know they should take less than 3 hours). I discussed with her that we've recently moved to new system and the lab has been having some difficulty, hence the delay and I have already spoken with the lab twice regarding her cbc. She remains upset. I again apologized for the delay but she is speaking loudly, cursing and will not be consoled. I encouraged her to stay for results of her lab work but she walked out of the ER.  Gayla DossEryka A Gawain Crombie, MD 12/17/14 [id number]  Gayla DossEryka A Elmin Wiederholt, MD 12/18/14 0010

## 2014-12-17 NOTE — ED Notes (Signed)
Received call through hospital operator after patient had left ED, patient speaking very loudly on phone, reporting that she had been escorted out of ED by hospital security. Informed patient that this nurse had tried to talk to her in her room and she slammed door. Patient continued speaking loudly on phone. Patient asked this nurse as well as primary nurses name, informed her of two first names she requested and patient hung up phone.

## 2014-12-17 NOTE — ED Notes (Signed)
Pt arrived via EMS from home abdominal pain x1 week. Pt not on menstrual cycle but has passed two blood clots this am and soaked through 2 tampons with pads. Pt also has extensive preganancy history. Has taken pregnancy test last week which was negative.

## 2014-12-17 NOTE — ED Notes (Signed)
Pt upset and slamming doors in nurses faces with lawyer on phone. Pt tried to ask nurses in the hallway to talk to her lawyer. Charge nurse and MD aware.

## 2014-12-22 NOTE — H&P (Signed)
L&D Evaluation:  History:  HPI 24 y/o [id number] [facility]wks [facility] with elevated blood presure (pt states she took her medicine this am and last nght) headache and epigastric discomfort. irregular mild cramping and backache. Denies leaking fluid or vaginal bleeding. Pregnancy complicated by Gestational HTN (current Labetelol 400mg  BID) asthma (no current medication) HX Stillbirth at 24 weeks, Obesity. GBS unknown.   Presents with above   Patient's Medical History No Chronic Illness   Patient's Surgical History none   Medications Pre Natal Vitamins  labetelol 400mg  BID (Begun 01/29/13 at 100mg  BID)   Allergies NKDA   Social History none   Family History Non-Contributory   ROS:  ROS All systems were reviewed.  HEENT, CNS, GI, GU, Respiratory, CV, Renal and Musculoskeletal systems were found to be normal.   Exam:  Vital Signs 166/89     174/102   Urine Protein to lab   General no apparent distress   Mental Status clear   Chest clear   Heart normal sinus rhythm   Abdomen gravid, non-tender   Estimated Fetal Weight Average for gestational age   Fetal Position vtx   Fundal Height appropriate   Back no CVAT   Edema 1+  pedal   Reflexes 2+   Clonus negative   Pelvic no external lesions, 3cm 50% vtx @ -2   Mebranes Intact   FHT normal rate with no decels, baseline 130's 140's avg variability with accels   FHT Description 136   Ucx EFM applied recently   Skin dry   Lymph no lymphadenopathy   Impression:  Impression early labor, evaluation for PIH   Plan:  Plan UA, EFM/NST, monitor contractions and for cervical change, PIH panel, antibiotics for GBBS prophylaxis, IOL @ 34/6wks PIH   Comments Admitted, explained what to expect. Husband coming from work to pick up toddler. Labs drawn await results. WIll consult with Dr Logan BoresEvans and [facility] re/plan of care.   Electronic Signatures: Albertina ParrLugiano, Taiga Lupinacci B (CNM)  (Signed  07-Jul-14 10:40)  Authored: L&D Evaluation   Last Updated: 07-Jul-14 10:40 by Albertina ParrLugiano, Sashia Campas B (CNM)

## 2014-12-22 NOTE — H&P (Signed)
L&D Evaluation:  History Expanded:  HPI 24 yo G6P2 at 4134w5d gestational age per patient reports presents to L&D at 12:07pm by EMS due to report of heavy vaginal bleeding.  She noted that she started bleeding about 40 minute prior to arrival.  She states that she had an uncomplicated pregnancy thus far.  She mentioned that she has some issues with blood pressure, but it had not been a problem recently.  She has a history of stillbirth.  There are no records available at this time.   Blood Type (Maternal) A positive   Group B Strep Results Maternal (Result >5wks must be treated as unknown) unknown/result > 5 weeks ago   Presents with vaginal bleeding   Patient's Medical History 1) Asthma, 2) obesity   Patient's Surgical History 1) hip surgery (patient did not mention, but noted pin on x-ray)   Medications Pre Natal Vitamins   Allergies NKDA   Social History denies   Family History Non-Contributory   Exam:  Vital Signs stable  afebrile, BPs not documented in system, but reported as "soft"   General mild-moderate distress   Mental Status clear   Chest moves air well   Abdomen gravid, mildly tender to palpation   Edema no edema   Pelvic large pool of blood on chux pad.   FHT attempted to find, briefly found a heart rate in 70s, then no heart rate.  Can not say definitively that heart rate was fetal   Impression:  Impression 1) Intrauterine pregnancy at 8334w5d gestational age, 2) placental abruption   Plan:  Comments STAT cesarean delivery.  Patient consented by me for cesarean delivery.  patient also consented by me for transfusion of blood products. Both occured prior to surgery. patient taken immediately to OR.  Noted time of entering OR is about 12:15.  Noted time of delivery about 12:24.  Incision time about 12:22pm.   No fetal heart rate for the first 6-7 minutes of resuscitation.  Fetal heart rate then did begin and was normal.  As of the time of this note there has  been no spontaneous fetal movement, though with ventilator support all other vitals are normal.   Initial gas taken from the cord was pH 6.9, PCO2 was 120. See labs below.  Will transfuse 2 units FFP and monitor maternal bleeding closely.  Will send urine drug screen as soon as have some amount of urine (zero when catheter inserted, 5mL at end of surgery).   Labs:  Lab Results: Lab:  14-Jun-15 13:00   pH (Cord Blood)  6.92  PCO2.  120 (Result(s) reported on 25 Jan 2014 at 01:23PM.)  Routine Chem:  14-Jun-15 13:13   Result Comment FIBRINOGEN - RESULTS VERIFIED BY REPEAT TESTING.  - NOTIFIED OF CRITICAL VALUE  - HP TO GAIL KIRBY@1355  ON 01/25/14  - READ-BACK PROCESS PERFORMED.  Result(s) reported on 25 Jan 2014 at 01:39PM.  Routine Coag:  14-Jun-15 13:13   Fibrinogen  96 (A HCT value >55% may artifactually increase the Fibrinogen.  In one study, the increase was an average of 10%. Reference:  "Effect of Routine and Special Coagulation Testing Values of Citrate Anticoagulant Adjustment in Patients with High HCT Values." American Journal of Clinical Pathology 2006;126:400-405.)  Prothrombin  18.1  INR 1.5 (INR reference interval applies to patients on anticoagulant therapy. A single INR therapeutic range for coumarins is not optimal for all indications; however, the suggested range for most indications is 2.0 - 3.0. Exceptions to the INR Reference  Range may include: Prosthetic heart valves, acute myocardial infarction, prevention of myocardial infarction, and combinations of aspirin and anticoagulant. The need for a higher or lower target INR must be assessed individually. Reference: The Pharmacology and Management of the Vitamin K  antagonists: the seventh ACCP Conference on Antithrombotic and Thrombolytic Therapy. Chest.2004 Sept:126 (3suppl): [id number]. A HCT value >55% may artifactually increase the PT.  In one study,  the increase was an average of 25%. Reference:  "Effect on  Routine and Special Coagulation Testing Values of Citrate Anticoagulant Adjustment in Patients with High HCT Values." American Journal of Clinical Pathology 2006;126:400-405.)  Activated PTT (APTT)  38.5 (A HCT value >55% may artifactually increase the APTT. In one study, the increase was an average of 19%. Reference: "Effect on Routine and Special Coagulation Testing Values of Citrate Anticoagulant Adjustment in Patients with High HCT Values." American Journal of Clinical Pathology 2006;126:400-405.)  Routine Hem:  14-Jun-15 13:13   WBC (CBC)  24.8  RBC (CBC)  2.51  Hemoglobin (CBC)  7.8  Hematocrit (CBC)  22.8  Platelet Count (CBC) 175  MCV 91  MCH 31.2  MCHC 34.3  RDW 13.0  Neutrophil % 85.2  Lymphocyte % 7.2  Monocyte % 7.1  Eosinophil % 0.2  Basophil % 0.3  Neutrophil #  21.1  Lymphocyte # 1.8  Monocyte #  1.8  Eosinophil # 0.0  Basophil # 0.1 (Result(s) reported on 25 Jan 2014 at 01:27PM.)   Electronic Signatures: Conard NovakJackson, Stephen D (MD)  (Signed 14-Jun-15 14:25)  Authored: L&D Evaluation, Labs   Last Updated: 14-Jun-15 14:25 by Conard NovakJackson, Stephen D (MD)

## 2014-12-22 NOTE — H&P (Signed)
L&D Evaluation:  History:  HPI 24yo [id number] wtih LMP of 06/18/12 & EDD of 03/25/13 here for "Elevated BP in the office today and sent to [facility] for serial BP's". Pt states HA on top of head which is relieved by Tylenol 30 to 40% of the way". Denies blurred vision but, occas has a fainty feeling.   Presents with Elevated BP   Medications Pre Natal Vitamins   Allergies NKDA   Family History Non-Contributory   ROS:  ROS All systems were reviewed.  HEENT, CNS, GI, GU, Respiratory, CV, Renal and Musculoskeletal systems were found to be normal.   Exam:  Vital Signs BP ranges: 137/83-163/96   Urine Protein Prot/creat ratio: 139   General no apparent distress   Mental Status clear   Chest clear   Heart normal sinus rhythm, no murmur/gallop/rubs   Abdomen gravid, non-tender   Estimated Fetal Weight Average for gestational age   Back no CVAT   Reflexes 1+    Mebranes Intact   Description clear   FHT normal rate with no decels, reactive nST with 2 accles 15 x 15 bpm   Ucx absent   Skin dry   Lymph no lymphadenopathy   Impression:  Impression evaluation for PIH, iUP at 33 weeks with elevated BP   Plan:  Plan Disc the plan with pt and Dr Logan BoresEvans   Comments Dr Logan BoresEvans advised for pt to take 400 mg Labetolol po BID to see if BP normalizes. Pt states she is taking meds but, they are not working. Pt was very appropriate today towards CNM but, got verbal with the RN stating that she needed a social worker to get her diapers and snacks for the baby with her.   Electronic Signatures: Sharee PimpleJones, Erma Raiche W (CNM)  (Signed 30-Jun-14 17:19)  Authored: L&D Evaluation   Last Updated: 30-Jun-14 17:19 by Sharee PimpleJones, Chanel Mcadams W (CNM)
